# Patient Record
Sex: Female | Born: 1960
Health system: Southern US, Community
[De-identification: ages and names within clinical notes are randomized; demographics above are authoritative.]

## PROBLEM LIST (undated history)

## (undated) DIAGNOSIS — I341 Nonrheumatic mitral (valve) prolapse: Secondary | ICD-10-CM

## (undated) DIAGNOSIS — D219 Benign neoplasm of connective and other soft tissue, unspecified: Secondary | ICD-10-CM

## (undated) DIAGNOSIS — N8003 Adenomyosis of the uterus: Secondary | ICD-10-CM

## (undated) DIAGNOSIS — N6459 Other signs and symptoms in breast: Secondary | ICD-10-CM

## (undated) DIAGNOSIS — N809 Endometriosis, unspecified: Secondary | ICD-10-CM

## (undated) DIAGNOSIS — R87629 Unspecified abnormal cytological findings in specimens from vagina: Secondary | ICD-10-CM

## (undated) DIAGNOSIS — N6019 Diffuse cystic mastopathy of unspecified breast: Secondary | ICD-10-CM

## (undated) DIAGNOSIS — R87619 Unspecified abnormal cytological findings in specimens from cervix uteri: Secondary | ICD-10-CM

## (undated) DIAGNOSIS — C73 Malignant neoplasm of thyroid gland: Secondary | ICD-10-CM

## (undated) DIAGNOSIS — IMO0002 Reserved for concepts with insufficient information to code with codable children: Secondary | ICD-10-CM

## (undated) DIAGNOSIS — N800 Endometriosis of uterus: Secondary | ICD-10-CM

## (undated) HISTORY — DX: Endometriosis, unspecified: N80.9

## (undated) HISTORY — PX: CHOLECYSTECTOMY: SHX55

## (undated) HISTORY — DX: Benign neoplasm of connective and other soft tissue, unspecified: D21.9

## (undated) HISTORY — PX: BREAST BIOPSY: SHX20

## (undated) HISTORY — DX: Reserved for concepts with insufficient information to code with codable children: IMO0002

## (undated) HISTORY — DX: Endometriosis of uterus: N80.0

## (undated) HISTORY — DX: Malignant neoplasm of thyroid gland: C73

## (undated) HISTORY — DX: Adenomyosis of the uterus: N80.03

## (undated) HISTORY — PX: COLPOSCOPY: SHX161

## (undated) HISTORY — DX: Unspecified abnormal cytological findings in specimens from cervix uteri: R87.619

## (undated) HISTORY — DX: Other signs and symptoms in breast: N64.59

## (undated) HISTORY — DX: Nonrheumatic mitral (valve) prolapse: I34.1

## (undated) HISTORY — DX: Unspecified abnormal cytological findings in specimens from vagina: R87.629

## (undated) HISTORY — DX: Diffuse cystic mastopathy of unspecified breast: N60.19

---

## 2000-04-17 HISTORY — PX: TOTAL THYROIDECTOMY: SHX2547

## 2000-09-17 HISTORY — PX: OTHER SURGICAL HISTORY: SHX169

## 2009-06-17 HISTORY — PX: GALLBLADDER SURGERY: SHX652

## 2009-10-10 ENCOUNTER — Emergency Department (HOSPITAL_COMMUNITY): Admission: EM | Admit: 2009-10-10 | Discharge: 2009-10-10 | Payer: Self-pay | Admitting: Emergency Medicine

## 2010-08-25 ENCOUNTER — Ambulatory Visit: Payer: Self-pay | Admitting: Obstetrics & Gynecology

## 2010-12-04 LAB — POCT I-STAT, CHEM 8
BUN: 12 mg/dL (ref 6–23)
Calcium, Ion: 1.15 mmol/L (ref 1.12–1.32)
Chloride: 106 mEq/L (ref 96–112)
Creatinine, Ser: 0.7 mg/dL (ref 0.4–1.2)
Glucose, Bld: 92 mg/dL (ref 70–99)
Potassium: 3.6 mEq/L (ref 3.5–5.1)

## 2010-12-04 LAB — POCT CARDIAC MARKERS
CKMB, poc: <1 ng/mL — ABNORMAL LOW (ref 1.0–8.0)
Troponin i, poc: <0.05 ng/mL (ref 0.00–0.09)

## 2011-03-06 ENCOUNTER — Ambulatory Visit (INDEPENDENT_AMBULATORY_CARE_PROVIDER_SITE_OTHER): Payer: 59 | Admitting: Obstetrics & Gynecology

## 2011-03-06 DIAGNOSIS — Z01419 Encounter for gynecological examination (general) (routine) without abnormal findings: Secondary | ICD-10-CM

## 2011-03-06 DIAGNOSIS — N920 Excessive and frequent menstruation with regular cycle: Secondary | ICD-10-CM

## 2011-03-06 DIAGNOSIS — Z803 Family history of malignant neoplasm of breast: Secondary | ICD-10-CM

## 2011-03-06 DIAGNOSIS — Z113 Encounter for screening for infections with a predominantly sexual mode of transmission: Secondary | ICD-10-CM

## 2011-03-06 DIAGNOSIS — Z1272 Encounter for screening for malignant neoplasm of vagina: Secondary | ICD-10-CM

## 2011-03-07 NOTE — Assessment & Plan Note (Unsigned)
NAMEBAKER, KOGLER                ACCOUNT NO.:  [id number]  MEDICAL RECORD NO.:  [id number]          PATIENT TYPE:  LOCATION:  [facility]           FACILITY:  PHYSICIAN:  Jaynie Collins, MD          DATE OF BIRTH:  DATE OF SERVICE:  03/06/2011                                 CLINIC NOTE  REASON FOR VISIT:  Establishing gynecologic care.  The patient is a 50 year old gravida 1, para 1 with the last menstrual period of February 23, 2011, who is here to establish gynecologic care.  The patient had been previously followed at [facility] and [facility] and does report that she has a concerning family history of breast cancer (her paternal grandmother and paternal first cousins) and also a history of ovarian cancer with her paternal grandmother, the same grandmother also had colorectal cancer, and she has her father and paternal uncles with all with prostate cancer.  The patient herself has a history of thyroid cancer and underwent a total thyroidectomy in 2011.  She is currently followed by Oncology Service at [facility].  The patient has reported that she has dense breasts with fibrocystic breast disease.  She has had multiple breast biopsies at [facility] and [facility] and has had to be followed with serial breast MRIs, the last one was about a year and a half ago.  The patient is requesting [place] to help her in ordering another breast MRI to be done in [place] as she is going back there in the next couple of weeks to follow up with her oncologist and wants to continue doing her breast MRIs there.  For any gynecologic concerns, the patient does not report any abnormal bleeding, abnormal discharge, problems with intercourse, or any other gynecologic concerns.  Her last Pap smear was also about a year and a half ago.  PAST OB/GYN HISTORY:  The patient had one cesarean section at term.  She had normal menstrual period.  No abnormal bleeding.  The patient is  not on any contraceptive medicine.  She does report having abnormal Pap smear many years ago and underwent a cone biopsy.  Her last mammogram was 2 years ago, which has followed her fibrocystic disease and dense breast.  She had a colonoscopy 4 years ago and her last test for occult blood in her stool was 2 years ago.  PAST MEDICAL HISTORY:  Thyroid cancer, fibrocystic breast disease, mitral valve prolapse, and pneumonia in the past.  PAST SURGICAL HISTORY:  Numerous breast biopsies, total thyroidectomy in 2001, cesarean section in 2004, cholecystectomy in 2010 at [facility].  MEDICATIONS:  Levoxyl 165 mcg, vitamins, and Advil as needed.  ALLERGIES:  No known drug allergies.  SOCIAL HISTORY:  The patient denies any smoking, alcohol, or illicit drug use.  Lives with her family.  FAMILY HISTORY:  Remarkable for the cancer history as above, but also an extensive family history of diabetes, heart disease, and high blood pressure.  REVIEW OF SYSTEMS:  The patient does report that she has occasional vaginal bleeding and pain with intercourse.  She does have a history of adenomyosis.  PHYSICAL EXAMINATION:  VITAL SIGNS:  Blood pressure 109/70, pulse  59, weight 150 pounds, height 5 feet 11 inches. GENERAL:  No apparent distress. HEENT:  Normocephalic, atraumatic. NECK:  A well-healed thyroid scar.  No masses palpated. LUNGS:  Clear to auscultation bilaterally. HEART:  Regular rate and rhythm. ABDOMEN:  Soft, nontender, and nondistended. BREASTS:  Symmetric, small, normal in size.  The patient does have some dense breast tissue but no discrete masses, lymphadenopathy, nipple drainage, or skin changes were found. EXTREMITIES:  No cyanosis, clubbing, or edema. PELVIC:  Normal external female genitalia.  Pink, well-rugated vagina. No bleeding seen.  Pap smear was obtained.  On bimanual exam, the patient has a normal-sized uterus.  No abnormal masses.  No adnexal tenderness or  masses elicited.  ASSESSMENT AND PLAN:  The patient is a 50 year old gravida 1, para 1 here to establish gynecologic care.  Her Pap smear was done today.  We will follow up on the results.  For her history of breast disease, we will try to obtain prior authorization from her insurance company and try to help her in booking a breast MRI at [facility] and [facility] and follow up with these results.  The patient was also counseled regarding kinetic counseling.  She says she will think about this and get back to [place] if she does want to proceed with this for irregular bleeding.  She is known to have a history of thyroid cancer and does have some thyroid dysfunction.  However, we will follow up her labs after she sees her oncologist to make sure her thyroid hormones are within control and also get a pelvic ultrasound to evaluate given her history of adenomyosis.  The patient is also going to need endometrial biopsy for further evaluation if this bleeding continues to be regular. We will follow up the pelvic ultrasound results and manage accordingly.          ______________________________ Jaynie Collins, MD    UA/MEDQ  D:  03/06/2011  T:  03/07/2011  Job:  [id number]

## 2011-03-08 ENCOUNTER — Other Ambulatory Visit: Payer: Self-pay | Admitting: Obstetrics & Gynecology

## 2011-03-08 DIAGNOSIS — N938 Other specified abnormal uterine and vaginal bleeding: Secondary | ICD-10-CM

## 2011-03-09 ENCOUNTER — Ambulatory Visit (HOSPITAL_COMMUNITY)
Admission: RE | Admit: 2011-03-09 | Discharge: 2011-03-09 | Disposition: A | Discharge status: Home / Self Care | Payer: 59 | Source: Ambulatory Visit | Attending: Obstetrics & Gynecology | Admitting: Obstetrics & Gynecology

## 2011-03-09 DIAGNOSIS — N938 Other specified abnormal uterine and vaginal bleeding: Secondary | ICD-10-CM | POA: Insufficient documentation

## 2011-03-09 DIAGNOSIS — N949 Unspecified condition associated with female genital organs and menstrual cycle: Secondary | ICD-10-CM | POA: Insufficient documentation

## 2011-03-09 DIAGNOSIS — N831 Corpus luteum cyst of ovary, unspecified side: Secondary | ICD-10-CM | POA: Insufficient documentation

## 2011-03-09 DIAGNOSIS — Z8543 Personal history of malignant neoplasm of ovary: Secondary | ICD-10-CM | POA: Insufficient documentation

## 2011-10-03 ENCOUNTER — Ambulatory Visit: Payer: 59 | Admitting: Obstetrics & Gynecology

## 2012-05-08 ENCOUNTER — Ambulatory Visit (INDEPENDENT_AMBULATORY_CARE_PROVIDER_SITE_OTHER): Payer: 59 | Admitting: Obstetrics & Gynecology

## 2012-05-08 ENCOUNTER — Encounter: Payer: Self-pay | Admitting: Obstetrics & Gynecology

## 2012-05-08 VITALS — BP 119/75 | HR 60 | Ht 71.0 in | Wt 152.0 lb

## 2012-05-08 DIAGNOSIS — N6019 Diffuse cystic mastopathy of unspecified breast: Secondary | ICD-10-CM

## 2012-05-08 DIAGNOSIS — Z124 Encounter for screening for malignant neoplasm of cervix: Secondary | ICD-10-CM

## 2012-05-08 DIAGNOSIS — C73 Malignant neoplasm of thyroid gland: Secondary | ICD-10-CM

## 2012-05-08 DIAGNOSIS — Z1151 Encounter for screening for human papillomavirus (HPV): Secondary | ICD-10-CM

## 2012-05-08 DIAGNOSIS — Z01419 Encounter for gynecological examination (general) (routine) without abnormal findings: Secondary | ICD-10-CM

## 2012-05-08 DIAGNOSIS — D219 Benign neoplasm of connective and other soft tissue, unspecified: Secondary | ICD-10-CM | POA: Insufficient documentation

## 2012-05-08 DIAGNOSIS — I341 Nonrheumatic mitral (valve) prolapse: Secondary | ICD-10-CM | POA: Insufficient documentation

## 2012-05-08 NOTE — Progress Notes (Signed)
  Subjective:     Jessica Stafford is a 51 y.o. female and is here for a comprehensive GYN exam. The patient reports no problems. Wants [place] to organize for her to get breast MRI at [facility] and [facility] in [place]. She has complex issues with her breast and multiple biopsies, and only trusts the [place] service. Had a bad experience when she tried [facility] last year, does not want to try [facility].  She is followed at [facility] yearly for her thyroid cancer, and gets her breast studies when she visits there. No other problems.  History   Social History  . Marital Status: Married    Spouse Name: N/A    Number of Children: N/A  . Years of Education: N/A   Occupational History  . Not on file.   Social History Main Topics  . Smoking status: Never Smoker   . Smokeless tobacco: Not on file  . Alcohol Use: No  . Drug Use: No  . Sexually Active: Yes -- Female partner(s)    Birth Control/ Protection: None   Other Topics Concern  . Not on file   Social History Narrative  . No narrative on file   No health maintenance topics applied.  The following portions of the patient's history were reviewed and updated as appropriate: allergies, current medications, past family history, past medical history, past social history, past surgical history and problem list.  Review of Systems Pertinent items are noted in HPI.   Objective:    Blood pressure 119/75, pulse 60, height 5\' 11"  (1.803 m), weight 152 lb (68.947 kg), last menstrual period 04/23/2012. GENERAL: Well-developed, well-nourished female in no acute distress.  HEENT: Normocephalic, atraumatic. Sclerae anicteric.  NECK: Supple. No masses palpated, incision from surgery well-healed.  LUNGS: Clear to auscultation bilaterally.  HEART: Regular rate and rhythm. BREASTS: Symmetric with everted nipples. Dense bilateral breasts with possible fibrocystic changes, no discrete lesions, skin changes, nipple drainage, or  lymphadenopathy. ABDOMEN: Soft, nontender, nondistended. No organomegaly. PELVIC: Normal external female genitalia. Vagina is pink and rugated.  Normal discharge. Normal cervix contour. Pap smear obtained. Uterus is normal in size. No adnexal mass or tenderness.  EXTREMITIES: No cyanosis, clubbing, or edema, 2+ distal pulses.   Assessment:    Healthy female exam.      Plan:    Pap done, follow up results and manage accordingly. Breast study to be scheduled in [place] as per patient request Routine preventative health maintenance measures emphasized

## 2012-05-08 NOTE — Patient Instructions (Addendum)
Preventive Care for Adults, Female A healthy lifestyle and preventive care can promote health and wellness. Preventive health guidelines for women include the following key practices.  A routine yearly physical is a good way to check with your caregiver about your health and preventive screening. It is a chance to share any concerns and updates on your health, and to receive a thorough exam.   Visit your dentist for a routine exam and preventive care every 6 months. Brush your teeth twice a day and floss once a day. Good oral hygiene prevents tooth decay and gum disease.   The frequency of eye exams is based on your age, health, family medical history, use of contact lenses, and other factors. Follow your caregiver's recommendations for frequency of eye exams.   Eat a healthy diet. Foods like vegetables, fruits, whole grains, low-fat dairy products, and lean protein foods contain the nutrients you need without too many calories. Decrease your intake of foods high in solid fats, added sugars, and salt. Eat the right amount of calories for you.Get information about a proper diet from your caregiver, if necessary.   Regular physical exercise is one of the most important things you can do for your health. Most adults should get at least 150 minutes of moderate-intensity exercise (any activity that increases your heart rate and causes you to sweat) each week. In addition, most adults need muscle-strengthening exercises on 2 or more days a week.   Maintain a healthy weight. The body mass index (BMI) is a screening tool to identify possible weight problems. It provides an estimate of body fat based on height and weight. Your caregiver can help determine your BMI, and can help you achieve or maintain a healthy weight.For adults 20 years and older:   A BMI below 18.5 is considered underweight.   A BMI of 18.5 to 24.9 is normal.   A BMI of 25 to 29.9 is considered overweight.   A BMI of 30 and above is  considered obese.   Maintain normal blood lipids and cholesterol levels by exercising and minimizing your intake of saturated fat. Eat a balanced diet with plenty of fruit and vegetables. Blood tests for lipids and cholesterol should begin at age 20 and be repeated every 5 years. If your lipid or cholesterol levels are high, you are over 50, or you are at high risk for heart disease, you may need your cholesterol levels checked more frequently.Ongoing high lipid and cholesterol levels should be treated with medicines if diet and exercise are not effective.   If you smoke, find out from your caregiver how to quit. If you do not use tobacco, do not start.   If you are pregnant, do not drink alcohol. If you are breastfeeding, be very cautious about drinking alcohol. If you are not pregnant and choose to drink alcohol, do not exceed 1 drink per day. One drink is considered to be 12 ounces (355 mL) of beer, 5 ounces (148 mL) of wine, or 1.5 ounces (44 mL) of liquor.   Avoid use of street drugs. Do not share needles with anyone. Ask for help if you need support or instructions about stopping the use of drugs.   High blood pressure causes heart disease and increases the risk of stroke. Your blood pressure should be checked at least every 1 to 2 years. Ongoing high blood pressure should be treated with medicines if weight loss and exercise are not effective.   If you are 55 to 49   years old, ask your caregiver if you should take aspirin to prevent strokes.   Diabetes screening involves taking a blood sample to check your fasting blood sugar level. This should be done once every 3 years, after age 45, if you are within normal weight and without risk factors for diabetes. Testing should be considered at a younger age or be carried out more frequently if you are overweight and have at least 1 risk factor for diabetes.   Breast cancer screening is essential preventive care for women. You should practice "breast  self-awareness." This means understanding the normal appearance and feel of your breasts and may include breast self-examination. Any changes detected, no matter how small, should be reported to a caregiver. Women in their 20s and 30s should have a clinical breast exam (CBE) by a caregiver as part of a regular health exam every 1 to 3 years. After age 40, women should have a CBE every year. Starting at age 40, women should consider having a mammography (breast X-ray test) every year. Women who have a family history of breast cancer should talk to their caregiver about genetic screening. Women at a high risk of breast cancer should talk to their caregivers about having magnetic resonance imaging (MRI) and a mammography every year.   The Pap test is a screening test for cervical cancer. A Pap test can show cell changes on the cervix that might become cervical cancer if left untreated. A Pap test is a procedure in which cells are obtained and examined from the lower end of the uterus (cervix).   Women should have a Pap test starting at age 21.   Between ages 21 and 29, Pap tests should be repeated every 2 years.   Beginning at age 30, you should have a Pap test every 3 years as long as the past 3 Pap tests have been normal.   Some women have medical problems that increase the chance of getting cervical cancer. Talk to your caregiver about these problems. It is especially important to talk to your caregiver if a new problem develops soon after your last Pap test. In these cases, your caregiver may recommend more frequent screening and Pap tests.   The above recommendations are the same for women who have or have not gotten the vaccine for human papillomavirus (HPV).   If you had a hysterectomy for a problem that was not cancer or a condition that could lead to cancer, then you no longer need Pap tests. Even if you no longer need a Pap test, a regular exam is a good idea to make sure no other problems are  starting.   If you are between ages 65 and 70, and you have had normal Pap tests going back 10 years, you no longer need Pap tests. Even if you no longer need a Pap test, a regular exam is a good idea to make sure no other problems are starting.   If you have had past treatment for cervical cancer or a condition that could lead to cancer, you need Pap tests and screening for cancer for at least 20 years after your treatment.   If Pap tests have been discontinued, risk factors (such as a new sexual partner) need to be reassessed to determine if screening should be resumed.   The HPV test is an additional test that may be used for cervical cancer screening. The HPV test looks for the virus that can cause the cell changes on the cervix.   The cells collected during the Pap test can be tested for HPV. The HPV test could be used to screen women aged 30 years and older, and should be used in women of any age who have unclear Pap test results. After the age of 30, women should have HPV testing at the same frequency as a Pap test.   Colorectal cancer can be detected and often prevented. Most routine colorectal cancer screening begins at the age of 50 and continues through age 75. However, your caregiver may recommend screening at an earlier age if you have risk factors for colon cancer. On a yearly basis, your caregiver may provide home test kits to check for hidden blood in the stool. Use of a small camera at the end of a tube, to directly examine the colon (sigmoidoscopy or colonoscopy), can detect the earliest forms of colorectal cancer. Talk to your caregiver about this at age 50, when routine screening begins. Direct examination of the colon should be repeated every 5 to 10 years through age 75, unless early forms of pre-cancerous polyps or small growths are found.   Hepatitis C blood testing is recommended for all people born from 1945 through 1965 and any individual with known risks for hepatitis C.    Practice safe sex. Use condoms and avoid high-risk sexual practices to reduce the spread of sexually transmitted infections (STIs). STIs include gonorrhea, chlamydia, syphilis, trichomonas, herpes, HPV, and human immunodeficiency virus (HIV). Herpes, HIV, and HPV are viral illnesses that have no cure. They can result in disability, cancer, and death. Sexually active women aged 25 and younger should be checked for chlamydia. Older women with new or multiple partners should also be tested for chlamydia. Testing for other STIs is recommended if you are sexually active and at increased risk.   Osteoporosis is a disease in which the bones lose minerals and strength with aging. This can result in serious bone fractures. The risk of osteoporosis can be identified using a bone density scan. Women ages 65 and over and women at risk for fractures or osteoporosis should discuss screening with their caregivers. Ask your caregiver whether you should take a calcium supplement or vitamin D to reduce the rate of osteoporosis.   Menopause can be associated with physical symptoms and risks. Hormone replacement therapy is available to decrease symptoms and risks. You should talk to your caregiver about whether hormone replacement therapy is right for you.   Use sunscreen with sun protection factor (SPF) of 30 or more. Apply sunscreen liberally and repeatedly throughout the day. You should seek shade when your shadow is shorter than you. Protect yourself by wearing long sleeves, pants, a wide-brimmed hat, and sunglasses year round, whenever you are outdoors.   Once a month, do a whole body skin exam, using a mirror to look at the skin on your back. Notify your caregiver of new moles, moles that have irregular borders, moles that are larger than a pencil eraser, or moles that have changed in shape or color.   Stay current with required immunizations.   Influenza. You need a dose every fall (or winter). The composition of  the flu vaccine changes each year, so being vaccinated once is not enough.   Pneumococcal polysaccharide. You need 1 to 2 doses if you smoke cigarettes or if you have certain chronic medical conditions. You need 1 dose at age 65 (or older) if you have never been vaccinated.   Tetanus, diphtheria, pertussis (Tdap, Td). Get 1 dose of   Tdap vaccine if you are younger than age 65, are over 65 and have contact with an infant, are a healthcare worker, are pregnant, or simply want to be protected from whooping cough. After that, you need a Td booster dose every 10 years. Consult your caregiver if you have not had at least 3 tetanus and diphtheria-containing shots sometime in your life or have a deep or dirty wound.   HPV. You need this vaccine if you are a woman age 26 or younger. The vaccine is given in 3 doses over 6 months.   Measles, mumps, rubella (MMR). You need at least 1 dose of MMR if you were born in 1957 or later. You may also need a second dose.   Meningococcal. If you are age 19 to 21 and a first-year college student living in a residence hall, or have one of several medical conditions, you need to get vaccinated against meningococcal disease. You may also need additional booster doses.   Zoster (shingles). If you are age 60 or older, you should get this vaccine.   Varicella (chickenpox). If you have never had chickenpox or you were vaccinated but received only 1 dose, talk to your caregiver to find out if you need this vaccine.   Hepatitis A. You need this vaccine if you have a specific risk factor for hepatitis A virus infection or you simply wish to be protected from this disease. The vaccine is usually given as 2 doses, 6 to 18 months apart.   Hepatitis B. You need this vaccine if you have a specific risk factor for hepatitis B virus infection or you simply wish to be protected from this disease. The vaccine is given in 3 doses, usually over 6 months.  Preventive Services /  Frequency Ages 19 to 39  Blood pressure check.** / Every 1 to 2 years.   Lipid and cholesterol check.** / Every 5 years beginning at age 20.   Clinical breast exam.** / Every 3 years for women in their 20s and 30s.   Pap test.** / Every 2 years from ages 21 through 29. Every 3 years starting at age 30 through age 65 or 70 with a history of 3 consecutive normal Pap tests.   HPV screening.** / Every 3 years from ages 30 through ages 65 to 70 with a history of 3 consecutive normal Pap tests.   Hepatitis C blood test.** / For any individual with known risks for hepatitis C.   Skin self-exam. / Monthly.   Influenza immunization.** / Every year.   Pneumococcal polysaccharide immunization.** / 1 to 2 doses if you smoke cigarettes or if you have certain chronic medical conditions.   Tetanus, diphtheria, pertussis (Tdap, Td) immunization. / A one-time dose of Tdap vaccine. After that, you need a Td booster dose every 10 years.   HPV immunization. / 3 doses over 6 months, if you are 26 and younger.   Measles, mumps, rubella (MMR) immunization. / You need at least 1 dose of MMR if you were born in 1957 or later. You may also need a second dose.   Meningococcal immunization. / 1 dose if you are age 19 to 21 and a first-year college student living in a residence hall, or have one of several medical conditions, you need to get vaccinated against meningococcal disease. You may also need additional booster doses.   Varicella immunization.** / Consult your caregiver.   Hepatitis A immunization.** / Consult your caregiver. 2 doses, 6 to 18 months   apart.   Hepatitis B immunization.** / Consult your caregiver. 3 doses usually over 6 months.  Ages 40 to 64  Blood pressure check.** / Every 1 to 2 years.   Lipid and cholesterol check.** / Every 5 years beginning at age 20.   Clinical breast exam.** / Every year after age 40.   Mammogram.** / Every year beginning at age 40 and continuing for as  long as you are in good health. Consult with your caregiver.   Pap test.** / Every 3 years starting at age 30 through age 65 or 70 with a history of 3 consecutive normal Pap tests.   HPV screening.** / Every 3 years from ages 30 through ages 65 to 70 with a history of 3 consecutive normal Pap tests.   Fecal occult blood test (FOBT) of stool. / Every year beginning at age 50 and continuing until age 75. You may not need to do this test if you get a colonoscopy every 10 years.   Flexible sigmoidoscopy or colonoscopy.** / Every 5 years for a flexible sigmoidoscopy or every 10 years for a colonoscopy beginning at age 50 and continuing until age 75.   Hepatitis C blood test.** / For all people born from 1945 through 1965 and any individual with known risks for hepatitis C.   Skin self-exam. / Monthly.   Influenza immunization.** / Every year.   Pneumococcal polysaccharide immunization.** / 1 to 2 doses if you smoke cigarettes or if you have certain chronic medical conditions.   Tetanus, diphtheria, pertussis (Tdap, Td) immunization.** / A one-time dose of Tdap vaccine. After that, you need a Td booster dose every 10 years.   Measles, mumps, rubella (MMR) immunization. / You need at least 1 dose of MMR if you were born in 1957 or later. You may also need a second dose.   Varicella immunization.** / Consult your caregiver.   Meningococcal immunization.** / Consult your caregiver.   Hepatitis A immunization.** / Consult your caregiver. 2 doses, 6 to 18 months apart.   Hepatitis B immunization.** / Consult your caregiver. 3 doses, usually over 6 months.  Ages 65 and over  Blood pressure check.** / Every 1 to 2 years.   Lipid and cholesterol check.** / Every 5 years beginning at age 20.   Clinical breast exam.** / Every year after age 40.   Mammogram.** / Every year beginning at age 40 and continuing for as long as you are in good health. Consult with your caregiver.   Pap test.** /  Every 3 years starting at age 30 through age 65 or 70 with a 3 consecutive normal Pap tests. Testing can be stopped between 65 and 70 with 3 consecutive normal Pap tests and no abnormal Pap or HPV tests in the past 10 years.   HPV screening.** / Every 3 years from ages 30 through ages 65 or 70 with a history of 3 consecutive normal Pap tests. Testing can be stopped between 65 and 70 with 3 consecutive normal Pap tests and no abnormal Pap or HPV tests in the past 10 years.   Fecal occult blood test (FOBT) of stool. / Every year beginning at age 50 and continuing until age 75. You may not need to do this test if you get a colonoscopy every 10 years.   Flexible sigmoidoscopy or colonoscopy.** / Every 5 years for a flexible sigmoidoscopy or every 10 years for a colonoscopy beginning at age 50 and continuing until age 75.   Hepatitis   C blood test.** / For all people born from 1945 through 1965 and any individual with known risks for hepatitis C.   Osteoporosis screening.** / A one-time screening for women ages 65 and over and women at risk for fractures or osteoporosis.   Skin self-exam. / Monthly.   Influenza immunization.** / Every year.   Pneumococcal polysaccharide immunization.** / 1 dose at age 65 (or older) if you have never been vaccinated.   Tetanus, diphtheria, pertussis (Tdap, Td) immunization. / A one-time dose of Tdap vaccine if you are over 65 and have contact with an infant, are a healthcare worker, or simply want to be protected from whooping cough. After that, you need a Td booster dose every 10 years.   Varicella immunization.** / Consult your caregiver.   Meningococcal immunization.** / Consult your caregiver.   Hepatitis A immunization.** / Consult your caregiver. 2 doses, 6 to 18 months apart.   Hepatitis B immunization.** / Check with your caregiver. 3 doses, usually over 6 months.  ** Family history and personal history of risk and conditions may change your caregiver's  recommendations. Document Released: 10/30/2001 Document Revised: 08/23/2011 Document Reviewed: 01/29/2011 [employer] Patient Information 2012 [employer]. 

## 2012-06-18 ENCOUNTER — Telehealth: Payer: Self-pay

## 2012-06-18 NOTE — Telephone Encounter (Signed)
After a while trying to set up this patient for a Breast MRI I was finally able to do it. I didn't have cpt codes or a referral in the system so this complicated me being able to schedule it. I called our radiology to ask for cpt code and was able to schedule it. Patient requested [facility] in [place]. She preffers to go there every year. I called the Radiology at [phone] gave them her MR# [id number] and Darl Pikes helped me schedule her for 07/01/12 at 1:20pm. They requested a order be faxed to them at [phone]. I called her Insurance carrier spt to another Darl Pikes and she did not need a authorization. Patient was informed of the appointment time and date.

## 2012-07-04 ENCOUNTER — Telehealth: Payer: Self-pay

## 2012-09-24 ENCOUNTER — Encounter: Payer: Self-pay | Admitting: Obstetrics & Gynecology

## 2013-03-13 ENCOUNTER — Telehealth: Payer: Self-pay | Admitting: Obstetrics & Gynecology

## 2013-03-13 DIAGNOSIS — Z809 Family history of malignant neoplasm, unspecified: Secondary | ICD-10-CM

## 2013-03-13 DIAGNOSIS — Z8041 Family history of malignant neoplasm of ovary: Secondary | ICD-10-CM

## 2013-03-13 NOTE — Telephone Encounter (Signed)
Patient called stating it is time for her yearly ultrasound due to family history of ovarian cancer.  Patient needs to have referral made if test is needed.

## 2013-03-14 DIAGNOSIS — Z809 Family history of malignant neoplasm, unspecified: Secondary | ICD-10-CM | POA: Insufficient documentation

## 2013-03-14 NOTE — Addendum Note (Signed)
Addended by: Jaynie Collins A on: 03/14/2013 04:28 PM   Modules accepted: Orders

## 2013-03-14 NOTE — Telephone Encounter (Signed)
Pelvic ultrasound ordered.  Please schedule and notify patient.  She is due for annual GYN exam after 05/08/2013.

## 2013-04-07 ENCOUNTER — Ambulatory Visit (HOSPITAL_COMMUNITY): Payer: Self-pay

## 2013-04-15 ENCOUNTER — Ambulatory Visit: Payer: 59 | Admitting: Family Medicine

## 2013-04-30 ENCOUNTER — Ambulatory Visit (HOSPITAL_COMMUNITY)
Admission: RE | Admit: 2013-04-30 | Discharge: 2013-04-30 | Disposition: A | Discharge status: Home / Self Care | Payer: BC Managed Care – PPO | Source: Ambulatory Visit | Attending: Obstetrics & Gynecology | Admitting: Obstetrics & Gynecology

## 2013-04-30 DIAGNOSIS — Z809 Family history of malignant neoplasm, unspecified: Secondary | ICD-10-CM

## 2013-04-30 DIAGNOSIS — Z8041 Family history of malignant neoplasm of ovary: Secondary | ICD-10-CM | POA: Insufficient documentation

## 2013-04-30 DIAGNOSIS — D259 Leiomyoma of uterus, unspecified: Secondary | ICD-10-CM | POA: Insufficient documentation

## 2013-05-04 ENCOUNTER — Telehealth: Payer: Self-pay | Admitting: *Deleted

## 2013-05-04 NOTE — Telephone Encounter (Signed)
Message copied by Grayland Ormond on Mon May 04, 2013  8:22 AM ------      Message from: Jaynie Collins A      Created: Thu Apr 30, 2013  1:42 PM       Normal ovaries, just shows fibroids.  Please call to inform patient of results.       ------

## 2013-05-04 NOTE — Telephone Encounter (Signed)
Pt aware of results 

## 2013-07-15 NOTE — Telephone Encounter (Signed)
Called patient about appointments and ultrasound. Left her a message.

## 2013-10-29 ENCOUNTER — Ambulatory Visit (INDEPENDENT_AMBULATORY_CARE_PROVIDER_SITE_OTHER): Payer: BC Managed Care – PPO | Admitting: Obstetrics & Gynecology

## 2013-10-29 ENCOUNTER — Encounter: Payer: Self-pay | Admitting: Obstetrics & Gynecology

## 2013-10-29 VITALS — BP 118/77 | HR 74 | Ht 71.0 in | Wt 154.0 lb

## 2013-10-29 DIAGNOSIS — Z0000 Encounter for general adult medical examination without abnormal findings: Secondary | ICD-10-CM

## 2013-10-29 DIAGNOSIS — Z124 Encounter for screening for malignant neoplasm of cervix: Secondary | ICD-10-CM

## 2013-10-29 DIAGNOSIS — Z1151 Encounter for screening for human papillomavirus (HPV): Secondary | ICD-10-CM

## 2013-10-29 DIAGNOSIS — Z01419 Encounter for gynecological examination (general) (routine) without abnormal findings: Secondary | ICD-10-CM

## 2013-10-29 NOTE — Progress Notes (Signed)
Subjective:    Jessica Stafford is a 53 y.o. female who presents for an annual exam. The patient has no complaints today.She thinks that she may have BV, odor. Her period also has gone longer than usual.  The patient is sexually active. GYN screening history: last pap: was normal. The patient wears seatbelts: yes. The patient participates in regular exercise: yes. Has the patient ever been transfused or tattooed?: no. The patient reports that there is not domestic violence in her life.   Menstrual History: OB History   Grav Para Term Preterm Abortions TAB SAB Ect Mult Living   1 1 1       1       Menarche age: 63    Patient's last menstrual period was 10/19/2013.    The following portions of the patient's history were reviewed and updated as appropriate: allergies, current medications, past family history, past medical history, past social history, past surgical history and problem list.  Review of Systems A comprehensive review of systems was negative. Married for 17 years. Homemaker. Flu vaccine given 10/14.   Objective:    BP 118/77  Pulse 74  Ht 5\' 11"  (1.803 m)  Wt 154 lb (69.854 kg)  BMI 21.49 kg/m2  LMP 10/19/2013  General Appearance:    Alert, cooperative, no distress, appears stated age  Head:    Normocephalic, without obvious abnormality, atraumatic  Eyes:    PERRL, conjunctiva/corneas clear, EOM's intact, fundi    benign, both eyes  Ears:    Normal TM's and external ear canals, both ears  Nose:   Nares normal, septum midline, mucosa normal, no drainage    or sinus tenderness  Throat:   Lips, mucosa, and tongue normal; teeth and gums normal  Neck:   Supple, symmetrical, trachea midline, no adenopathy;    thyroid:  no enlargement/tenderness/nodules; no carotid   bruit or JVD  Back:     Symmetric, no curvature, ROM normal, no CVA tenderness  Lungs:     Clear to auscultation bilaterally, respirations unlabored  Chest Wall:    No tenderness or deformity   Heart:     Regular rate and rhythm, S1 and S2 normal, no murmur, rub   or gallop  Breast Exam:    No tenderness, masses, or nipple abnormality  Abdomen:     Soft, non-tender, bowel sounds active all four quadrants,    no masses, no organomegaly  Genitalia:    Normal female without lesion, discharge or tenderness     Extremities:   Extremities normal, atraumatic, no cyanosis or edema  Pulses:   2+ and symmetric all extremities  Skin:   Skin color, texture, turgor normal, no rashes or lesions  Lymph nodes:   Cervical, supraclavicular, and axillary nodes normal  Neurologic:   CNII-XII intact, normal strength, sensation and reflexes    throughout  .    Assessment:    Healthy female exam.    Plan:     Breast self exam technique reviewed and patient encouraged to perform self-exam monthly.  Annual MRI in [place], due in the near future Pap with cotesting

## 2013-11-04 ENCOUNTER — Telehealth: Payer: Self-pay | Admitting: *Deleted

## 2013-11-04 NOTE — Telephone Encounter (Signed)
Message copied by Erik Obey on Wed Nov 04, 2013  3:51 PM ------      Message from: Clovia Cuff C      Created: Wed Nov 04, 2013  2:03 PM       She will need a colpo.      Thanks ------

## 2013-11-04 NOTE — Telephone Encounter (Signed)
Patient has been notified of results and she will call us back in the morning once she has her schedule in front of her to set up an appointment in [place] with Dr. Hulan Fray.

## 2014-07-19 ENCOUNTER — Encounter: Payer: Self-pay | Admitting: Obstetrics & Gynecology

## 2015-07-26 ENCOUNTER — Ambulatory Visit (INDEPENDENT_AMBULATORY_CARE_PROVIDER_SITE_OTHER): Payer: BLUE CROSS/BLUE SHIELD | Admitting: Obstetrics & Gynecology

## 2015-07-26 VITALS — BP 122/83 | HR 61 | Resp 18 | Ht 71.0 in | Wt 144.0 lb

## 2015-07-26 DIAGNOSIS — Z30430 Encounter for insertion of intrauterine contraceptive device: Secondary | ICD-10-CM | POA: Diagnosis not present

## 2015-07-26 DIAGNOSIS — N921 Excessive and frequent menstruation with irregular cycle: Secondary | ICD-10-CM | POA: Diagnosis not present

## 2015-07-26 DIAGNOSIS — Z01812 Encounter for preprocedural laboratory examination: Secondary | ICD-10-CM

## 2015-07-26 LAB — CBC
HCT: 31 % — ABNORMAL LOW (ref 36.0–46.0)
Hemoglobin: 10.6 g/dL — ABNORMAL LOW (ref 12.0–15.0)
MCH: 31.6 pg (ref 26.0–34.0)
MCHC: 34.2 g/dL (ref 30.0–36.0)
MCV: 92.5 fL (ref 78.0–100.0)
MPV: 10.1 fL (ref 8.6–12.4)
PLATELETS: 291 10*3/uL (ref 150–400)
RBC: 3.35 MIL/uL — ABNORMAL LOW (ref 3.87–5.11)
RDW: 13.6 % (ref 11.5–15.5)
WBC: 5.4 10*3/uL (ref 4.0–10.5)

## 2015-07-26 LAB — POCT URINE PREGNANCY: PREG TEST UR: NEGATIVE

## 2015-07-26 MED ORDER — MEGESTROL ACETATE 40 MG PO TABS: 40.0000 mg | ORAL_TABLET | Freq: Two times a day (BID) | ORAL | Status: AC

## 2015-07-26 MED ORDER — IBUPROFEN 200 MG PO TABS
800.0000 mg | ORAL_TABLET | Freq: Once | ORAL | Status: AC
Start: 2015-07-26 — End: 2015-07-26
  Administered 2015-07-26: 800 mg via ORAL

## 2015-07-26 NOTE — Progress Notes (Signed)
Pt states her cycles have become irregular, started her cycle 2 weeks ago and has been having heavy vaginal bleeding since.

## 2015-07-26 NOTE — Progress Notes (Signed)
   Subjective:    Patient ID: Jessica Stafford, female    DOB: [birth year], 54 y.o.   MRN: [id number]  HPI 54 yo MW P1 (25 yo son) here to discuss her heavy bleeding for the last 2 weeks. Prior to this episode of bleeding, she had gone 4 months without bleeding.    Review of Systems She had a colpo in [place] [facility] in Spring 2015. Pap also in [facility] this summer "normal". Her husband has had prostate cancer and she is not exposed to sperm. He is considered sterile. She had a gyn u/s this past summer in [place]. She also had a EMBX 2015 in [place], also negative. She declines flu vaccine    Objective:   Physical Exam [id number] Breathing, conversing, and ambulating normally  UPT negative, consent signed, Time out procedure done. Cervix prepped with betadine and grasped with a single tooth tenaculum. Mirena was easily placed and the strings were cut to 3-4 cm. Uterus sounded to 9 cm. She tolerated the procedure well.         Assessment & Plan:  DUB/perimenopausal- offered cyclic progestin, continuous progestin, OCPs (but she has sore boobs), endometrial ablation, Mirena  Megace 40 mg BID for 10 days RTC prn

## 2015-07-26 NOTE — Addendum Note (Signed)
Addended by: Ricka Burdock on: 07/26/2015 09:36 AM   Modules accepted: Orders

## 2015-07-27 ENCOUNTER — Encounter: Payer: Self-pay | Admitting: *Deleted

## 2015-08-04 ENCOUNTER — Telehealth: Payer: Self-pay | Admitting: *Deleted

## 2015-08-04 DIAGNOSIS — D259 Leiomyoma of uterus, unspecified: Secondary | ICD-10-CM

## 2015-08-04 NOTE — Telephone Encounter (Signed)
-----   Message from Francia Greaves sent at 08/04/2015  8:51 AM EST ----- Regarding: Patient Contact: [phone] Janny called the office this morning, to schedule an appt for AUB on 11/21, she is requesting to have an ultrasound done prior to this visit and would like to discuss results with you at this visit. Would you like to put in an order for this or would you rather her come to the visit first?

## 2015-08-04 NOTE — Telephone Encounter (Signed)
Spoke to pt, has been seen in office for AUB, was given rx for Megace 40mg  and IUD inserted on 07-26-15.  Has a hx of uterine fibroids as well.  States that her bleeding is continuing and is increasing.  Informed pt that her symptoms could be related to the IUD as it is trying to thin the inner lining of her uterus and can happen the first few months after insertion.  Pt requesting [place] due to her concerns and would like to make sure the fibroids are not getting larger.  Dr Elly Modena was in office at the time of the patients call and reviewed pt chart.  Stated we could order follow-up US.  Pt also scheduled appt for 11-21 with Dr Hulan Fray.  Informed pt that we could just call her with the [place] report and notify her of any recommendations bc her bleeding at this time was more than likely coming from the IUD just recently being inserted.  Pt was adamant about keeping her appointment and coming in to follow-up with Dr Hulan Fray on 08-08-15.

## 2015-08-08 ENCOUNTER — Ambulatory Visit: Payer: BLUE CROSS/BLUE SHIELD | Admitting: Obstetrics & Gynecology

## 2015-08-10 ENCOUNTER — Ambulatory Visit (HOSPITAL_COMMUNITY): Payer: BLUE CROSS/BLUE SHIELD

## 2015-08-15 ENCOUNTER — Ambulatory Visit (HOSPITAL_COMMUNITY)
Admission: RE | Admit: 2015-08-15 | Discharge: 2015-08-15 | Disposition: A | Discharge status: Home / Self Care | Payer: BLUE CROSS/BLUE SHIELD | Source: Ambulatory Visit | Attending: Obstetrics and Gynecology | Admitting: Obstetrics and Gynecology

## 2015-08-15 DIAGNOSIS — Z30431 Encounter for routine checking of intrauterine contraceptive device: Secondary | ICD-10-CM | POA: Insufficient documentation

## 2015-08-15 DIAGNOSIS — N939 Abnormal uterine and vaginal bleeding, unspecified: Secondary | ICD-10-CM | POA: Insufficient documentation

## 2015-08-15 DIAGNOSIS — D259 Leiomyoma of uterus, unspecified: Secondary | ICD-10-CM | POA: Insufficient documentation

## 2015-08-15 DIAGNOSIS — N800 Endometriosis of uterus: Secondary | ICD-10-CM | POA: Diagnosis not present

## 2015-08-17 ENCOUNTER — Encounter: Payer: Self-pay | Admitting: Obstetrics & Gynecology

## 2015-08-17 ENCOUNTER — Ambulatory Visit (INDEPENDENT_AMBULATORY_CARE_PROVIDER_SITE_OTHER): Payer: BLUE CROSS/BLUE SHIELD | Admitting: Obstetrics & Gynecology

## 2015-08-17 VITALS — BP 136/67 | HR 63 | Temp 98.4°F | Ht 71.0 in | Wt 149.0 lb

## 2015-08-17 DIAGNOSIS — D259 Leiomyoma of uterus, unspecified: Secondary | ICD-10-CM | POA: Diagnosis not present

## 2015-08-17 MED ORDER — MEDROXYPROGESTERONE ACETATE 10 MG PO TABS: 10.0000 mg | ORAL_TABLET | Freq: Every day | ORAL | Status: AC

## 2015-08-17 NOTE — Progress Notes (Signed)
   Subjective:    Patient ID: Jessica Stafford, female    DOB: 1961/07/26, 54 y.o.   MRN: [id number]  HPI  54 yo [facility] patient here today for a IUD check. She had it placed 07/26/15 for some perimenopausal bleeding. An u/s was ordered and showed stable uterus and normal adnexa and appropriate placement of Mirena. She is having some irregular bleeding with the Mirena but knows that this is expected. She started taking megace 40 mg daily at the same time her IUD was placed. She gives a h/o this same irregular bleeding in about 1996.  Review of Systems     Objective:   Physical Exam [id number] Breathing, conversing, and ambulating normally Abd- benign       Assessment & Plan:  DUB- very very tired of DUB In the meantime, until Mirena starts to give her amenorrhea, change to provera 10 mg daily.

## 2015-12-28 DIAGNOSIS — I872 Venous insufficiency (chronic) (peripheral): Secondary | ICD-10-CM | POA: Diagnosis not present

## 2015-12-28 DIAGNOSIS — I83813 Varicose veins of bilateral lower extremities with pain: Secondary | ICD-10-CM | POA: Diagnosis not present

## 2016-01-31 DIAGNOSIS — C73 Malignant neoplasm of thyroid gland: Secondary | ICD-10-CM | POA: Diagnosis not present

## 2016-02-01 DIAGNOSIS — N939 Abnormal uterine and vaginal bleeding, unspecified: Secondary | ICD-10-CM | POA: Diagnosis not present

## 2016-02-01 DIAGNOSIS — Z9089 Acquired absence of other organs: Secondary | ICD-10-CM | POA: Diagnosis not present

## 2016-02-01 DIAGNOSIS — D259 Leiomyoma of uterus, unspecified: Secondary | ICD-10-CM | POA: Diagnosis not present

## 2016-02-01 DIAGNOSIS — C73 Malignant neoplasm of thyroid gland: Secondary | ICD-10-CM | POA: Diagnosis not present

## 2016-02-01 DIAGNOSIS — Z3202 Encounter for pregnancy test, result negative: Secondary | ICD-10-CM | POA: Diagnosis not present

## 2016-02-01 DIAGNOSIS — Z85850 Personal history of malignant neoplasm of thyroid: Secondary | ICD-10-CM | POA: Diagnosis not present

## 2016-02-02 DIAGNOSIS — Z872 Personal history of diseases of the skin and subcutaneous tissue: Secondary | ICD-10-CM | POA: Diagnosis not present

## 2016-02-02 DIAGNOSIS — Z808 Family history of malignant neoplasm of other organs or systems: Secondary | ICD-10-CM | POA: Diagnosis not present

## 2016-02-02 DIAGNOSIS — L814 Other melanin hyperpigmentation: Secondary | ICD-10-CM | POA: Diagnosis not present

## 2016-02-02 DIAGNOSIS — Z86018 Personal history of other benign neoplasm: Secondary | ICD-10-CM | POA: Diagnosis not present

## 2016-02-17 ENCOUNTER — Other Ambulatory Visit (HOSPITAL_COMMUNITY): Payer: Self-pay | Admitting: Internal Medicine

## 2016-02-17 DIAGNOSIS — C73 Malignant neoplasm of thyroid gland: Secondary | ICD-10-CM

## 2016-03-05 ENCOUNTER — Ambulatory Visit (HOSPITAL_COMMUNITY): Payer: BLUE CROSS/BLUE SHIELD

## 2016-03-06 ENCOUNTER — Ambulatory Visit (HOSPITAL_COMMUNITY): Payer: BLUE CROSS/BLUE SHIELD

## 2016-03-07 ENCOUNTER — Inpatient Hospital Stay (HOSPITAL_COMMUNITY)
Admission: RE | Admit: 2016-03-07 | Discharge: 2016-03-07 | Disposition: A | Discharge status: Home / Self Care | Payer: BLUE CROSS/BLUE SHIELD | Source: Ambulatory Visit | Attending: Internal Medicine | Admitting: Internal Medicine

## 2016-03-09 ENCOUNTER — Encounter (HOSPITAL_COMMUNITY): Payer: BLUE CROSS/BLUE SHIELD

## 2016-04-04 DIAGNOSIS — Z09 Encounter for follow-up examination after completed treatment for conditions other than malignant neoplasm: Secondary | ICD-10-CM | POA: Diagnosis not present

## 2016-04-04 DIAGNOSIS — I83811 Varicose veins of right lower extremities with pain: Secondary | ICD-10-CM | POA: Diagnosis not present

## 2016-05-03 DIAGNOSIS — L578 Other skin changes due to chronic exposure to nonionizing radiation: Secondary | ICD-10-CM | POA: Diagnosis not present

## 2016-05-03 DIAGNOSIS — D229 Melanocytic nevi, unspecified: Secondary | ICD-10-CM | POA: Diagnosis not present

## 2016-05-03 DIAGNOSIS — D485 Neoplasm of uncertain behavior of skin: Secondary | ICD-10-CM | POA: Diagnosis not present

## 2016-05-03 DIAGNOSIS — L570 Actinic keratosis: Secondary | ICD-10-CM | POA: Diagnosis not present

## 2016-05-03 DIAGNOSIS — L820 Inflamed seborrheic keratosis: Secondary | ICD-10-CM | POA: Diagnosis not present

## 2016-05-15 ENCOUNTER — Encounter: Payer: Self-pay | Admitting: Obstetrics & Gynecology

## 2016-05-15 ENCOUNTER — Ambulatory Visit (INDEPENDENT_AMBULATORY_CARE_PROVIDER_SITE_OTHER): Payer: BLUE CROSS/BLUE SHIELD | Admitting: Obstetrics & Gynecology

## 2016-05-15 VITALS — BP 131/78 | HR 69 | Ht 71.0 in | Wt 145.0 lb

## 2016-05-15 DIAGNOSIS — N939 Abnormal uterine and vaginal bleeding, unspecified: Secondary | ICD-10-CM

## 2016-05-15 DIAGNOSIS — Z30432 Encounter for removal of intrauterine contraceptive device: Secondary | ICD-10-CM

## 2016-05-16 DIAGNOSIS — N939 Abnormal uterine and vaginal bleeding, unspecified: Secondary | ICD-10-CM | POA: Insufficient documentation

## 2016-05-16 NOTE — Progress Notes (Signed)
    GYNECOLOGY CLINIC PROCEDURE NOTE  Jessica Stafford is a 55 y.o. G1P1001 here for Mirena IUD removal. Mirena was placed on 07/26/2015 to help with AUB.  She does not want this anymore, now desires surgical management. on No GYN concerns.  Last pap smear was on 10/29/13 and showed ASCUS, +HRHPV with negative colposcopy.  Normal follow up pap in 2016 at [facility] was negative.  IUD Removal  Patient identified, informed consent performed, consent signed.  Patient was in the dorsal lithotomy position, normal external genitalia was noted.  A speculum was placed in the patient's vagina, normal discharge was noted, no lesions. The cervix was visualized, no lesions, no abnormal discharge.  The strings of the IUD were grasped and pulled using ring forceps. The IUD was removed in its entirety. Patient tolerated the procedure well.    Discussed surgical management options for abnormal uterine bleeding including endometrial ablation (Novasure/Hydrothermal Ablation) or hysterectomy as definitive surgical management.  Discussed risks and benefits of each method. All questions were answered.   Patient desires endometrial ablation. She declines any interim hormonal treatment, bleeding precautions reviewed.  She was told that she will be contacted by our surgical scheduler regarding the time and date of her surgery; routine preoperative instructions of having nothing to eat or drink after midnight on the day prior to surgery and also coming to the hospital 1.5 hours prior to her time of surgery were also emphasized.  She was told she may be called for a preoperative appointment about a week prior to surgery and will be given further preoperative instructions at that visit. Printed patient education handouts about the procedure were given to the patient to review at home.  Total counseling time: 25 minutes   Verita Schneiders, MD, [place] Attending [place], [facility] for  [employer], [place]

## 2016-05-16 NOTE — Patient Instructions (Signed)
Endometrial Ablation °Endometrial ablation removes the lining of the uterus (endometrium). It is usually a same-day, outpatient treatment. Ablation helps avoid major surgery, such as surgery to remove the cervix and uterus (hysterectomy). After endometrial ablation, you will have little or no menstrual bleeding and may not be able to have children. However, if you are premenopausal, you will need to use a reliable method of birth control following the procedure because of the small chance that pregnancy can occur. °There are different reasons to have this procedure. These reasons include: °· Heavy periods. °· Bleeding that is causing anemia. °· Irregular bleeding. °· Bleeding fibroids on the lining inside the uterus if they are smaller than 3 centimeters. °This procedure may not be possible for you if:  °· You want to have children in the future.   °· You have severe cramps with your menstrual period.   °· You have precancerous or cancerous cells in your uterus.   °· You were recently pregnant.   °· You have gone through menopause.   °· You have had major surgery on your uterus, resulting in thinning of the uterine wall. Surgeries may include: °¨ The removal of one or more uterine fibroids (myomectomy). °¨ A cesarean section with a classic (vertical) incision on your uterus. Ask your health care provider what type of cesarean you had. Sometimes the scar on your skin is different than the scar on your uterus. °Even if you have had surgery on your uterus, certain types of ablation may still be safe for you. Talk with your health care provider. °LET YOUR HEALTH CARE PROVIDER KNOW ABOUT: °· Any allergies you have. °· All medicines you are taking, including vitamins, herbs, eye drops, creams, and over-the-counter medicines. °· Previous problems you or members of your family have had with the use of anesthetics. °· Any blood disorders you have. °· Previous surgeries you have had. °· Medical conditions you have. °RISKS AND  COMPLICATIONS  °Generally, this is a safe procedure. However, as with any procedure, complications can occur. Possible complications include: °· Perforation of the uterus. °· Bleeding. °· Infection of the uterus, bladder, or vagina. °· Injury to surrounding organs. °· An air bubble to the lung (air embolus). °· Pregnancy following the procedure. °· Failure of the procedure to help the problem, requiring hysterectomy. °· Decreased ability to diagnose cancer in the lining of the uterus. °BEFORE THE PROCEDURE °· The lining of the uterus must be tested to make sure there is no pre-cancerous or cancer cells present. °· An ultrasound may be performed to look at the size of the uterus and to check for abnormalities. °· Medicines may be given to thin the lining of the uterus. °PROCEDURE  °During the procedure, your health care provider will use a tool called a resectoscope to help see inside your uterus. There are different ways to remove the lining of your uterus.  °· Radiofrequency - This method uses a radiofrequency-alternating electric current to remove the lining of the uterus. °· Cryotherapy - This method uses extreme cold to freeze the lining of the uterus. °· Heated-Free Liquid - This method uses heated salt (saline) solution to remove the lining of the uterus. °· Microwave - This method uses high-energy microwaves to heat up the lining of the uterus to remove it. °· Thermal balloon - This method involves inserting a catheter with a balloon tip into the uterus. The balloon tip is filled with heated fluid to remove the lining of the uterus. °AFTER THE PROCEDURE  °After your procedure, do   not have sexual intercourse or insert anything into your vagina until permitted by your health care provider. After the procedure, you may experience: °· Cramps. °· Vaginal discharge. °· Frequent urination. °  °This information is not intended to replace advice given to you by your health care provider. Make sure you discuss any  questions you have with your health care provider. °  °Document Released: 07/13/2004 Document Revised: 05/25/2015 Document Reviewed: 02/04/2013 °Elsevier Interactive Patient Education ©2016 [employer] ° °

## 2016-05-17 ENCOUNTER — Encounter (HOSPITAL_COMMUNITY): Payer: Self-pay | Admitting: *Deleted

## 2016-05-22 ENCOUNTER — Encounter: Payer: Self-pay | Admitting: *Deleted

## 2016-06-05 ENCOUNTER — Ambulatory Visit: Admit: 2016-06-05 | Payer: BLUE CROSS/BLUE SHIELD | Admitting: Obstetrics & Gynecology

## 2016-06-05 SURGERY — DILATATION & CURETTAGE/HYSTEROSCOPY WITH HYDROTHERMAL ABLATION
Anesthesia: Choice | Site: Vagina

## 2016-07-16 DIAGNOSIS — E038 Other specified hypothyroidism: Secondary | ICD-10-CM | POA: Diagnosis not present

## 2016-07-16 DIAGNOSIS — Z0000 Encounter for general adult medical examination without abnormal findings: Secondary | ICD-10-CM | POA: Diagnosis not present

## 2016-07-23 DIAGNOSIS — E038 Other specified hypothyroidism: Secondary | ICD-10-CM | POA: Diagnosis not present

## 2016-07-23 DIAGNOSIS — Z0000 Encounter for general adult medical examination without abnormal findings: Secondary | ICD-10-CM | POA: Diagnosis not present

## 2016-07-23 DIAGNOSIS — Z1389 Encounter for screening for other disorder: Secondary | ICD-10-CM | POA: Diagnosis not present

## 2016-07-23 DIAGNOSIS — Z803 Family history of malignant neoplasm of breast: Secondary | ICD-10-CM | POA: Diagnosis not present

## 2016-07-23 DIAGNOSIS — Z85850 Personal history of malignant neoplasm of thyroid: Secondary | ICD-10-CM | POA: Diagnosis not present

## 2016-07-23 DIAGNOSIS — C73 Malignant neoplasm of thyroid gland: Secondary | ICD-10-CM | POA: Diagnosis not present

## 2016-08-01 DIAGNOSIS — D485 Neoplasm of uncertain behavior of skin: Secondary | ICD-10-CM | POA: Diagnosis not present

## 2016-08-01 DIAGNOSIS — Z872 Personal history of diseases of the skin and subcutaneous tissue: Secondary | ICD-10-CM | POA: Diagnosis not present

## 2016-08-01 DIAGNOSIS — L729 Follicular cyst of the skin and subcutaneous tissue, unspecified: Secondary | ICD-10-CM | POA: Diagnosis not present

## 2016-08-01 DIAGNOSIS — L820 Inflamed seborrheic keratosis: Secondary | ICD-10-CM | POA: Diagnosis not present

## 2016-08-01 DIAGNOSIS — L300 Nummular dermatitis: Secondary | ICD-10-CM | POA: Diagnosis not present

## 2016-08-01 DIAGNOSIS — L578 Other skin changes due to chronic exposure to nonionizing radiation: Secondary | ICD-10-CM | POA: Diagnosis not present

## 2016-09-29 DIAGNOSIS — Z23 Encounter for immunization: Secondary | ICD-10-CM | POA: Diagnosis not present

## 2016-10-16 DIAGNOSIS — L989 Disorder of the skin and subcutaneous tissue, unspecified: Secondary | ICD-10-CM | POA: Diagnosis not present

## 2016-10-16 DIAGNOSIS — Z85828 Personal history of other malignant neoplasm of skin: Secondary | ICD-10-CM | POA: Diagnosis not present

## 2016-10-16 DIAGNOSIS — L309 Dermatitis, unspecified: Secondary | ICD-10-CM | POA: Diagnosis not present

## 2016-10-29 DIAGNOSIS — C73 Malignant neoplasm of thyroid gland: Secondary | ICD-10-CM | POA: Diagnosis not present

## 2016-10-30 DIAGNOSIS — R938 Abnormal findings on diagnostic imaging of other specified body structures: Secondary | ICD-10-CM | POA: Diagnosis not present

## 2016-10-30 DIAGNOSIS — D251 Intramural leiomyoma of uterus: Secondary | ICD-10-CM | POA: Diagnosis not present

## 2016-10-30 DIAGNOSIS — C73 Malignant neoplasm of thyroid gland: Secondary | ICD-10-CM | POA: Diagnosis not present

## 2016-10-30 DIAGNOSIS — N951 Menopausal and female climacteric states: Secondary | ICD-10-CM | POA: Diagnosis not present

## 2016-11-02 DIAGNOSIS — R8290 Unspecified abnormal findings in urine: Secondary | ICD-10-CM | POA: Diagnosis not present

## 2016-11-02 DIAGNOSIS — C73 Malignant neoplasm of thyroid gland: Secondary | ICD-10-CM | POA: Diagnosis not present

## 2016-11-02 DIAGNOSIS — Z85850 Personal history of malignant neoplasm of thyroid: Secondary | ICD-10-CM | POA: Diagnosis not present

## 2016-11-30 ENCOUNTER — Telehealth: Payer: Self-pay | Admitting: *Deleted

## 2016-11-30 DIAGNOSIS — Z1231 Encounter for screening mammogram for malignant neoplasm of breast: Secondary | ICD-10-CM

## 2016-11-30 NOTE — Telephone Encounter (Signed)
[place] and receives her mammogram at a facility where she is located at this time. Called requesting an order for a mammogram.  Order placed.

## 2016-12-17 DIAGNOSIS — C73 Malignant neoplasm of thyroid gland: Secondary | ICD-10-CM | POA: Diagnosis not present

## 2016-12-17 DIAGNOSIS — Z9049 Acquired absence of other specified parts of digestive tract: Secondary | ICD-10-CM | POA: Diagnosis not present

## 2016-12-17 DIAGNOSIS — Z9089 Acquired absence of other organs: Secondary | ICD-10-CM | POA: Diagnosis not present

## 2016-12-17 DIAGNOSIS — Z85850 Personal history of malignant neoplasm of thyroid: Secondary | ICD-10-CM | POA: Diagnosis not present

## 2016-12-17 DIAGNOSIS — C770 Secondary and unspecified malignant neoplasm of lymph nodes of head, face and neck: Secondary | ICD-10-CM | POA: Diagnosis not present

## 2017-01-07 DIAGNOSIS — Z1231 Encounter for screening mammogram for malignant neoplasm of breast: Secondary | ICD-10-CM | POA: Diagnosis not present

## 2017-01-07 DIAGNOSIS — R921 Mammographic calcification found on diagnostic imaging of breast: Secondary | ICD-10-CM | POA: Diagnosis not present

## 2017-01-07 DIAGNOSIS — C73 Malignant neoplasm of thyroid gland: Secondary | ICD-10-CM | POA: Diagnosis not present

## 2017-01-09 DIAGNOSIS — R928 Other abnormal and inconclusive findings on diagnostic imaging of breast: Secondary | ICD-10-CM | POA: Diagnosis not present

## 2017-01-09 DIAGNOSIS — Z803 Family history of malignant neoplasm of breast: Secondary | ICD-10-CM | POA: Diagnosis not present

## 2017-01-09 DIAGNOSIS — R921 Mammographic calcification found on diagnostic imaging of breast: Secondary | ICD-10-CM | POA: Diagnosis not present

## 2017-01-09 DIAGNOSIS — R87810 Cervical high risk human papillomavirus (HPV) DNA test positive: Secondary | ICD-10-CM | POA: Diagnosis not present

## 2017-01-09 DIAGNOSIS — Z8041 Family history of malignant neoplasm of ovary: Secondary | ICD-10-CM | POA: Diagnosis not present

## 2017-01-09 DIAGNOSIS — Z01419 Encounter for gynecological examination (general) (routine) without abnormal findings: Secondary | ICD-10-CM | POA: Diagnosis not present

## 2017-01-10 DIAGNOSIS — R928 Other abnormal and inconclusive findings on diagnostic imaging of breast: Secondary | ICD-10-CM | POA: Diagnosis not present

## 2017-01-11 DIAGNOSIS — R920 Mammographic microcalcification found on diagnostic imaging of breast: Secondary | ICD-10-CM | POA: Diagnosis not present

## 2017-01-11 DIAGNOSIS — R921 Mammographic calcification found on diagnostic imaging of breast: Secondary | ICD-10-CM | POA: Diagnosis not present

## 2017-02-08 DIAGNOSIS — E039 Hypothyroidism, unspecified: Secondary | ICD-10-CM | POA: Diagnosis not present

## 2017-02-08 DIAGNOSIS — C73 Malignant neoplasm of thyroid gland: Secondary | ICD-10-CM | POA: Diagnosis not present

## 2017-02-08 DIAGNOSIS — R946 Abnormal results of thyroid function studies: Secondary | ICD-10-CM | POA: Diagnosis not present

## 2017-02-08 DIAGNOSIS — Z79899 Other long term (current) drug therapy: Secondary | ICD-10-CM | POA: Diagnosis not present

## 2017-03-01 DIAGNOSIS — J0100 Acute maxillary sinusitis, unspecified: Secondary | ICD-10-CM | POA: Diagnosis not present

## 2017-03-19 DIAGNOSIS — J0141 Acute recurrent pansinusitis: Secondary | ICD-10-CM | POA: Diagnosis not present

## 2017-03-26 DIAGNOSIS — R928 Other abnormal and inconclusive findings on diagnostic imaging of breast: Secondary | ICD-10-CM | POA: Diagnosis not present

## 2017-03-26 DIAGNOSIS — R921 Mammographic calcification found on diagnostic imaging of breast: Secondary | ICD-10-CM | POA: Diagnosis not present

## 2017-03-27 DIAGNOSIS — Z803 Family history of malignant neoplasm of breast: Secondary | ICD-10-CM | POA: Diagnosis not present

## 2017-03-27 DIAGNOSIS — Z85850 Personal history of malignant neoplasm of thyroid: Secondary | ICD-10-CM | POA: Diagnosis not present

## 2017-03-27 DIAGNOSIS — Z8042 Family history of malignant neoplasm of prostate: Secondary | ICD-10-CM | POA: Diagnosis not present

## 2017-03-27 DIAGNOSIS — Z9889 Other specified postprocedural states: Secondary | ICD-10-CM | POA: Diagnosis not present

## 2017-03-27 DIAGNOSIS — R928 Other abnormal and inconclusive findings on diagnostic imaging of breast: Secondary | ICD-10-CM | POA: Diagnosis not present

## 2017-04-05 DIAGNOSIS — N6081 Other benign mammary dysplasias of right breast: Secondary | ICD-10-CM | POA: Diagnosis not present

## 2017-04-05 DIAGNOSIS — R921 Mammographic calcification found on diagnostic imaging of breast: Secondary | ICD-10-CM | POA: Diagnosis not present

## 2017-04-05 DIAGNOSIS — R920 Mammographic microcalcification found on diagnostic imaging of breast: Secondary | ICD-10-CM | POA: Diagnosis not present

## 2017-04-05 DIAGNOSIS — N6021 Fibroadenosis of right breast: Secondary | ICD-10-CM | POA: Diagnosis not present

## 2017-04-05 DIAGNOSIS — R928 Other abnormal and inconclusive findings on diagnostic imaging of breast: Secondary | ICD-10-CM | POA: Diagnosis not present

## 2017-04-09 DIAGNOSIS — Z8042 Family history of malignant neoplasm of prostate: Secondary | ICD-10-CM | POA: Diagnosis not present

## 2017-04-09 DIAGNOSIS — Z8041 Family history of malignant neoplasm of ovary: Secondary | ICD-10-CM | POA: Diagnosis not present

## 2017-04-09 DIAGNOSIS — Z803 Family history of malignant neoplasm of breast: Secondary | ICD-10-CM | POA: Diagnosis not present

## 2017-04-15 DIAGNOSIS — R928 Other abnormal and inconclusive findings on diagnostic imaging of breast: Secondary | ICD-10-CM | POA: Diagnosis not present

## 2017-04-16 ENCOUNTER — Ambulatory Visit: Payer: BLUE CROSS/BLUE SHIELD | Admitting: Obstetrics & Gynecology

## 2017-04-22 DIAGNOSIS — Z6821 Body mass index (BMI) 21.0-21.9, adult: Secondary | ICD-10-CM | POA: Diagnosis not present

## 2017-04-22 DIAGNOSIS — Z8041 Family history of malignant neoplasm of ovary: Secondary | ICD-10-CM | POA: Diagnosis not present

## 2017-04-22 DIAGNOSIS — Z79899 Other long term (current) drug therapy: Secondary | ICD-10-CM | POA: Diagnosis not present

## 2017-04-22 DIAGNOSIS — Z8042 Family history of malignant neoplasm of prostate: Secondary | ICD-10-CM | POA: Diagnosis not present

## 2017-04-22 DIAGNOSIS — Z803 Family history of malignant neoplasm of breast: Secondary | ICD-10-CM | POA: Diagnosis not present

## 2017-04-22 DIAGNOSIS — Z01818 Encounter for other preprocedural examination: Secondary | ICD-10-CM | POA: Diagnosis not present

## 2017-04-22 DIAGNOSIS — R922 Inconclusive mammogram: Secondary | ICD-10-CM | POA: Diagnosis not present

## 2017-04-23 DIAGNOSIS — J310 Chronic rhinitis: Secondary | ICD-10-CM | POA: Diagnosis not present

## 2017-04-23 DIAGNOSIS — J069 Acute upper respiratory infection, unspecified: Secondary | ICD-10-CM | POA: Diagnosis not present

## 2017-04-23 DIAGNOSIS — J343 Hypertrophy of nasal turbinates: Secondary | ICD-10-CM | POA: Diagnosis not present

## 2017-04-23 DIAGNOSIS — R05 Cough: Secondary | ICD-10-CM | POA: Diagnosis not present

## 2017-04-25 ENCOUNTER — Ambulatory Visit: Payer: BLUE CROSS/BLUE SHIELD | Admitting: Obstetrics & Gynecology

## 2017-04-29 DIAGNOSIS — Z7189 Other specified counseling: Secondary | ICD-10-CM | POA: Diagnosis not present

## 2017-04-29 DIAGNOSIS — Z6820 Body mass index (BMI) 20.0-20.9, adult: Secondary | ICD-10-CM | POA: Diagnosis not present

## 2017-05-15 DIAGNOSIS — Z6820 Body mass index (BMI) 20.0-20.9, adult: Secondary | ICD-10-CM | POA: Diagnosis not present

## 2017-05-15 DIAGNOSIS — Z803 Family history of malignant neoplasm of breast: Secondary | ICD-10-CM | POA: Diagnosis not present

## 2017-05-17 ENCOUNTER — Ambulatory Visit: Payer: BLUE CROSS/BLUE SHIELD | Admitting: Obstetrics & Gynecology

## 2017-05-17 ENCOUNTER — Other Ambulatory Visit: Payer: Self-pay | Admitting: Internal Medicine

## 2017-05-17 DIAGNOSIS — Z8679 Personal history of other diseases of the circulatory system: Secondary | ICD-10-CM

## 2017-05-19 DIAGNOSIS — H10021 Other mucopurulent conjunctivitis, right eye: Secondary | ICD-10-CM | POA: Diagnosis not present

## 2017-05-19 DIAGNOSIS — Z23 Encounter for immunization: Secondary | ICD-10-CM | POA: Diagnosis not present

## 2017-05-22 DIAGNOSIS — R928 Other abnormal and inconclusive findings on diagnostic imaging of breast: Secondary | ICD-10-CM | POA: Diagnosis not present

## 2017-05-23 ENCOUNTER — Other Ambulatory Visit (HOSPITAL_COMMUNITY): Payer: BLUE CROSS/BLUE SHIELD

## 2017-05-27 DIAGNOSIS — Z1379 Encounter for other screening for genetic and chromosomal anomalies: Secondary | ICD-10-CM | POA: Diagnosis not present

## 2017-05-27 DIAGNOSIS — R928 Other abnormal and inconclusive findings on diagnostic imaging of breast: Secondary | ICD-10-CM | POA: Diagnosis not present

## 2017-06-06 ENCOUNTER — Other Ambulatory Visit: Payer: Self-pay

## 2017-06-06 ENCOUNTER — Ambulatory Visit (HOSPITAL_COMMUNITY): Payer: BLUE CROSS/BLUE SHIELD | Attending: Cardiology

## 2017-06-06 DIAGNOSIS — I340 Nonrheumatic mitral (valve) insufficiency: Secondary | ICD-10-CM | POA: Diagnosis not present

## 2017-06-06 DIAGNOSIS — I341 Nonrheumatic mitral (valve) prolapse: Secondary | ICD-10-CM | POA: Diagnosis not present

## 2017-06-06 DIAGNOSIS — Z8679 Personal history of other diseases of the circulatory system: Secondary | ICD-10-CM | POA: Diagnosis not present

## 2017-06-07 ENCOUNTER — Ambulatory Visit (INDEPENDENT_AMBULATORY_CARE_PROVIDER_SITE_OTHER): Payer: BLUE CROSS/BLUE SHIELD | Admitting: Obstetrics & Gynecology

## 2017-06-07 ENCOUNTER — Other Ambulatory Visit (HOSPITAL_COMMUNITY)
Admission: RE | Admit: 2017-06-07 | Discharge: 2017-06-07 | Disposition: A | Discharge status: Home / Self Care | Payer: BLUE CROSS/BLUE SHIELD | Source: Ambulatory Visit | Attending: Obstetrics & Gynecology | Admitting: Obstetrics & Gynecology

## 2017-06-07 VITALS — BP 109/70 | HR 55 | Wt 148.8 lb

## 2017-06-07 DIAGNOSIS — R938 Abnormal findings on diagnostic imaging of other specified body structures: Secondary | ICD-10-CM

## 2017-06-07 DIAGNOSIS — R9389 Abnormal findings on diagnostic imaging of other specified body structures: Secondary | ICD-10-CM

## 2017-06-07 DIAGNOSIS — Z1151 Encounter for screening for human papillomavirus (HPV): Secondary | ICD-10-CM

## 2017-06-07 DIAGNOSIS — Z124 Encounter for screening for malignant neoplasm of cervix: Secondary | ICD-10-CM | POA: Diagnosis not present

## 2017-06-07 DIAGNOSIS — N858 Other specified noninflammatory disorders of uterus: Secondary | ICD-10-CM | POA: Diagnosis not present

## 2017-06-07 NOTE — Patient Instructions (Signed)
Return to clinic for any scheduled appointments or for any gynecologic concerns as needed.   

## 2017-06-07 NOTE — Progress Notes (Signed)
GYNECOLOGY OFFICE VISIT NOTE  History:  56 y.o. G1P1001 here today for follow up for AUB.  No recent AUB, but had a pelvic ultrasound done in [place] (see report from [place] below) that showed a 5.5 cm thickened stripe. She requests endometrial sampling. Also has been seen several times in [place] for thyroid cancer surveillance. Had abnormal calcifications on mammogram and was followed by surgeon in [place], but now is followed at [facility]. She denies any abnormal vaginal discharge, bleeding, pelvic pain or other concerns.   Past Medical History:  Diagnosis Date  . Abnormal Pap smear   . Adenomyosis   . Fibrocystic breast   . Fibroids   . Mitral valve prolapse   . Thyroid cancer ([place])   . Vaginal Pap smear, abnormal     Past Surgical History:  Procedure Laterality Date  . BREAST BIOPSY     NUMEROUS TIMES  . CESAREAN SECTION    . CHOLECYSTECTOMY    . COLPOSCOPY    . fibriod removal  2002   uterine  . GALLBLADDER SURGERY  06/2009  . TOTAL THYROIDECTOMY  04/2000    The following portions of the patient's history were reviewed and updated as appropriate: allergies, current medications, past family history, past medical history, past social history, past surgical history and problem list.   Health Maintenance:  ASCUS pap and positive HRHPV on 10/29/2013 with negative colposcopy.   Review of Systems:  Pertinent items noted in HPI and remainder of comprehensive ROS otherwise negative.   Objective:  Physical Exam BP 109/70   Pulse (!) 55   Wt 148 lb 12.8 oz (67.5 kg)   BMI 20.75 kg/m  CONSTITUTIONAL: Well-developed, well-nourished female in no acute distress.  HENT:  Normocephalic, atraumatic. External right and left ear normal. Oropharynx is clear and moist EYES: Conjunctivae and EOM are normal. Pupils are equal, round, and reactive to light. No scleral icterus.  NECK: Normal range of motion, supple, no masses SKIN: Skin is warm and dry. No rash noted. Not diaphoretic. No  erythema. No pallor. NEUROLOGIC: Alert and oriented to person, place, and time. Normal reflexes, muscle tone coordination. No cranial nerve deficit noted. PSYCHIATRIC: Normal mood and affect. Normal behavior. Normal judgment and thought content. CARDIOVASCULAR: Normal heart rate noted RESPIRATORY: Effort and breath sounds normal, no problems with respiration noted ABDOMEN: Soft, no distention noted.   PELVIC: Normal appearing external genitalia; normal appearing vaginal mucosa with atrophy and cervix.  No abnormal discharge noted.  Small uterine size, no other palpable masses, no uterine or adnexal tenderness. MUSCULOSKELETAL: Normal range of motion. No edema noted.  ENDOMETRIAL BIOPSY     The indications for endometrial biopsy were reviewed.   Risks of the biopsy including cramping, bleeding, infection, uterine perforation, inadequate specimen and need for additional procedures  were discussed. The patient states she understands and agrees to undergo procedure today. Consent was signed. Time out was performed. During the pelvic exam, the cervix was prepped with Betadine. A single-toothed tenaculum was placed on the anterior lip of the cervix to stabilize it. The 3 mm pipelle was introduced into the endometrial cavity without difficulty to a depth of 5 cm, and a small amount of tissue was obtained and sent to pathology. The instruments were removed from the patient's vagina. Minimal bleeding from the cervix was noted. The patient tolerated the procedure well. Routine post-procedure instructions were given to the patient.     Labs and Imaging 10/30/2016 Pelvic Ultrasound Result Impression  Diffuse endometrial thickening measuring  up to 5.67mm, which could represent a neoplastic process in the setting of postmenopausal bleeding. Multiple small intramural fibroids.  RECOMMENDATIONS: Consider endometrial sampling if in the setting of postmenopausal bleeding.  TECHNIQUE: Transabdominal and  transvaginal ultrasound imaging of the pelvis was performed. Transvaginal imaging was performed to provide superior visualization of the endometrium and/or adnexa. 3D reconstructions were created and reviewed  COMPARISON: [place] SONOHYSTEROGRAM WITH TRANSVAGINAL 02/01/2016.  FINDINGS:  KIDNEYS: Unremarkable.  UTERUS: The endometrial echocomplex measures 5.54mm, which is diffusely thickened.The uterus measures 4.3 x 4.8 x 3.3 cm.Multiple small intramural fibroids are again seen, some of which are partially calcified. These measure up to 1.9cm and are not significantly changed.   OVARIES/ADNEXA: Unremarkable  PELVIS:No free fluid.     Assessment & Plan:  1. Thickened endometrium - Surgical pathology, will follow up results and manage accordingly. Bleeding precautions reviewed  2. Pap smear for cervical cancer screening - Cytology - PAP done today. Routine preventative health maintenance measures emphasized. Please refer to After Visit Summary for other counseling recommendations.   Return for any GYN concerns.   Total face-to-face time with patient: 25 minutes. Over 50% of encounter was spent on counseling and coordination of care.   Verita Schneiders, MD, [place] Attending [place], [facility] for [employer], [place]

## 2017-06-10 DIAGNOSIS — Z1501 Genetic susceptibility to malignant neoplasm of breast: Secondary | ICD-10-CM | POA: Diagnosis not present

## 2017-06-10 DIAGNOSIS — Z6820 Body mass index (BMI) 20.0-20.9, adult: Secondary | ICD-10-CM | POA: Diagnosis not present

## 2017-06-11 LAB — CYTOLOGY - PAP
DIAGNOSIS: NEGATIVE
HPV (WINDOPATH): NOT DETECTED

## 2017-06-12 ENCOUNTER — Encounter: Payer: Self-pay | Admitting: *Deleted

## 2017-06-12 DIAGNOSIS — R001 Bradycardia, unspecified: Secondary | ICD-10-CM | POA: Diagnosis not present

## 2017-06-12 DIAGNOSIS — R002 Palpitations: Secondary | ICD-10-CM | POA: Diagnosis not present

## 2017-06-12 DIAGNOSIS — I341 Nonrheumatic mitral (valve) prolapse: Secondary | ICD-10-CM | POA: Diagnosis not present

## 2017-06-13 DIAGNOSIS — C50919 Malignant neoplasm of unspecified site of unspecified female breast: Secondary | ICD-10-CM | POA: Diagnosis not present

## 2017-06-13 DIAGNOSIS — Z803 Family history of malignant neoplasm of breast: Secondary | ICD-10-CM | POA: Diagnosis not present

## 2017-06-13 DIAGNOSIS — R921 Mammographic calcification found on diagnostic imaging of breast: Secondary | ICD-10-CM | POA: Diagnosis not present

## 2017-06-27 ENCOUNTER — Encounter (HOSPITAL_COMMUNITY): Payer: Self-pay

## 2017-06-27 ENCOUNTER — Emergency Department (HOSPITAL_COMMUNITY)
Admission: EM | Admit: 2017-06-27 | Discharge: 2017-06-27 | Disposition: A | Discharge status: Home / Self Care | Payer: BLUE CROSS/BLUE SHIELD | Attending: Emergency Medicine | Admitting: Emergency Medicine

## 2017-06-27 DIAGNOSIS — R002 Palpitations: Secondary | ICD-10-CM | POA: Diagnosis not present

## 2017-06-27 DIAGNOSIS — Z85850 Personal history of malignant neoplasm of thyroid: Secondary | ICD-10-CM | POA: Insufficient documentation

## 2017-06-27 DIAGNOSIS — R079 Chest pain, unspecified: Secondary | ICD-10-CM | POA: Diagnosis not present

## 2017-06-27 LAB — CBC
HEMATOCRIT: 42.2 % (ref 36.0–46.0)
Hemoglobin: 14.2 g/dL (ref 12.0–15.0)
MCH: 31.1 pg (ref 26.0–34.0)
MCHC: 33.6 g/dL (ref 30.0–36.0)
MCV: 92.5 fL (ref 78.0–100.0)
PLATELETS: 244 10*3/uL (ref 150–400)
RBC: 4.56 MIL/uL (ref 3.87–5.11)
RDW: 12.7 % (ref 11.5–15.5)
WBC: 6 10*3/uL (ref 4.0–10.5)

## 2017-06-27 LAB — BASIC METABOLIC PANEL
Anion gap: 10 (ref 5–15)
BUN: 9 mg/dL (ref 6–20)
CO2: 30 mmol/L (ref 22–32)
Calcium: 9.9 mg/dL (ref 8.9–10.3)
Chloride: 97 mmol/L — ABNORMAL LOW (ref 101–111)
Creatinine, Ser: 0.71 mg/dL (ref 0.44–1.00)
GFR calc Af Amer: >60 mL/min (ref >60)
Glucose, Bld: 106 mg/dL — ABNORMAL HIGH (ref 65–99)
POTASSIUM: 3.2 mmol/L — AB (ref 3.5–5.1)
SODIUM: 137 mmol/L (ref 135–145)

## 2017-06-27 LAB — I-STAT TROPONIN, ED
TROPONIN I, POC: 0.01 ng/mL (ref 0.00–0.08)
Troponin i, poc: 0.02 ng/mL (ref 0.00–0.08)

## 2017-06-27 LAB — POC URINE PREG, ED: Preg Test, Ur: NEGATIVE

## 2017-06-27 LAB — D-DIMER, QUANTITATIVE (NOT AT ARMC)

## 2017-06-27 MED ORDER — POTASSIUM CHLORIDE CRYS ER 10 MEQ PO TBCR
10.0000 meq | EXTENDED_RELEASE_TABLET | Freq: Once | ORAL | Status: AC
  Administered 2017-06-27: 10 meq via ORAL
  Filled 2017-06-27: qty 1

## 2017-06-27 NOTE — Discharge Instructions (Signed)
Follow-up with her primary care doctor in next 2-4 days for further evaluation.  Follow-up with referred cardiologist. Arrange for an appointment in the next few days. Tell them you were seen in the Emergency Department.   Return the emergency Department for any worsening palpitations, chest pain, difficulty breathing, fevers, vomiting or any other worsening or concerning symptoms.

## 2017-06-27 NOTE — ED Triage Notes (Signed)
Pt presents for evaluation of L sided CP below breast with lightheadedness. Pt reports started today, hx of mitral valve prolapse and thyroidectomy for thyroid CA. Pt reports blood pressure cuff at home stated she had Afib and a low heart rate.

## 2017-06-27 NOTE — ED Provider Notes (Signed)
Jessica Stafford Provider Note   CSN: [id number] Arrival date & time: 06/27/17  1310     History   Chief Complaint Chief Complaint  Patient presents with  . Chest Pain    HPI Jessica Stafford is a 56 y.o. female with PMH/o MVP, Thyroid cancer who presents with intermittent palpitations and left sided CP that began today at approximately 1130. Patient reports that she has had intermittent palpitations for the last month and a half. She states that today, she started expressing left-sided chest pain and pain that went into her back approximately 1:30. Patient states that she did not take any medications for the symptoms. Patient reports that on ED arrival, the pain is completely resolved. Patient reports that pain was not worse with deep inspiration or worse with exertion. She does report feeling slightly nauseous denies any diaphoresis or vomiting. Patient reports that she is currently undergoing evaluation for potential thyroid cancer and was seen by her doctor in [place]. Patient reports at that time, she was initially complaining of symptoms and he arranged for her to have an EKG and an echo. Patient had the results but has not been followed up by cardiologist. Patient states she has been diagnosed with bradycardia. Patient states that she does not smoke. Patient reports that no PE and her family had MIs but does report there is extensive valve history. She denies any OCP use, recent immobilization, prior history of DVT/PE, recent surgery, leg swelling, or long travel. Patient states that she does not have a current active diagnosis of cancer but states she is being evaluated for possible recurrence of her thyroid cancer. Patient's denies any fevers, chills, shortness of breath, abdominal pain, vomiting, dysuria, hematuria.   The history is provided by the patient.    Past Medical History:  Diagnosis Date  . Abnormal Pap smear   . Adenomyosis   . Fibrocystic breast   .  Fibroids   . Mitral valve prolapse   . Thyroid cancer ([place])   . Vaginal Pap smear, abnormal     Patient Active Problem List   Diagnosis Date Noted  . Abnormal uterine bleeding (AUB) 05/16/2016  . Family history of cancers 03/14/2013  . Mitral valve prolapse   . Thyroid cancer ([place])   . Fibrocystic breast   . Fibroids     Past Surgical History:  Procedure Laterality Date  . BREAST BIOPSY     NUMEROUS TIMES  . CESAREAN SECTION    . CHOLECYSTECTOMY    . COLPOSCOPY    . fibriod removal  2002   uterine  . GALLBLADDER SURGERY  06/2009  . TOTAL THYROIDECTOMY  04/2000    OB History    Gravida Para Term Preterm AB Living   1 1 1     1    SAB TAB Ectopic Multiple Live Births           1       Home Medications    Prior to Admission medications   Medication Sig Start Date End Date Taking? Authorizing Provider  Calcium Citrate-Vitamin D (CALCIUM + D PO) Take 1 tablet by mouth as needed (low calcium).    Yes [provider]  ibuprofen (ADVIL) 200 MG tablet Take 400 mg by mouth every 6 (six) hours as needed (pain).    Yes [provider]  levothyroxine (LEVOXYL) 175 MCG tablet Take 175 mcg by mouth daily.    Yes [provider]  magnesium 30 MG tablet Take 30 mg  by mouth as needed.    Yes [provider]  Multiple Vitamin (MULTIVITAMIN) tablet Take 1 tablet by mouth as needed.    Yes [provider]  Omega-3 Fatty Acids (OMEGA 3 PO) Take 1 capsule by mouth as needed.    Yes [provider]  Thiamine Mononitrate (VITAMIN B1 PO) Take 1 tablet by mouth as needed.   Yes [provider]  medroxyPROGESTERone (PROVERA) 10 MG tablet Take 1 tablet (10 mg total) by mouth daily. Use for ten days Patient not taking: Reported on 06/07/2017 08/17/15   Emily Filbert, MD  megestrol (MEGACE) 40 MG tablet Take 1 tablet (40 mg total) by mouth 2 (two) times daily. Patient not taking: Reported on 05/15/2016 07/26/15   Emily Filbert, MD     Family History Family History  Problem Relation Age of Onset  . Hypertension Mother   . Hypothyroidism Mother   . Heart disease Mother        mitral valve prolaspe  . Heart disease Father   . Hypertension Father   . Cancer Father        prostate  . Melanoma Brother   . Valvular heart disease Maternal Grandmother   . Diabetes Paternal Grandmother   . Cancer Paternal Grandmother        colon and ovarian  . Melanoma Brother   . Valvular heart disease Brother   . Valvular heart disease Brother     Social History Social History  Substance Use Topics  . Smoking status: Never Smoker  . Smokeless tobacco: Not on file  . Alcohol use No     Allergies   Patient has no known allergies.   Review of Systems Review of Systems  Constitutional: Negative for chills and fever.  Eyes: Negative for visual disturbance.  Respiratory: Negative for cough and shortness of breath.   Cardiovascular: Positive for chest pain and palpitations.  Gastrointestinal: Negative for abdominal pain, nausea and vomiting.  Genitourinary: Negative for dysuria and hematuria.  Musculoskeletal: Negative for back pain and neck pain.  Neurological: Negative for weakness and numbness.     Physical Exam Updated Vital Signs BP (!) 142/85   Pulse (!) 52   Temp 97.8 F (36.6 C) (Oral)   Resp 18   SpO2 100%   Physical Exam  Constitutional: She is oriented to person, place, and time. She appears well-developed and well-nourished.  Sitting comfortably on examination table  HENT:  Head: Normocephalic and atraumatic.  Mouth/Throat: Oropharynx is clear and moist and mucous membranes are normal.  Eyes: Pupils are equal, round, and reactive to light. Conjunctivae, EOM and lids are normal.  Neck: Full passive range of motion without pain.  Cardiovascular: Regular rhythm, normal heart sounds and normal pulses.  Bradycardia present.  Exam reveals no gallop and no friction rub.   No murmur  heard. Pulmonary/Chest: Effort normal and breath sounds normal.  No evidence of respiratory distress. Able to speak in full sentences without difficulty.  Abdominal: Soft. Normal appearance. There is no tenderness. There is no rigidity and no guarding.  Musculoskeletal: Normal range of motion.  Neurological: She is alert and oriented to person, place, and time.  Skin: Skin is warm and dry. Capillary refill takes less than 2 seconds.  Psychiatric: She has a normal mood and affect. Her speech is normal.  Nursing note and vitals reviewed.    ED Treatments / Results  Labs (all labs ordered are listed, but only abnormal results are displayed) Labs Reviewed  BASIC METABOLIC PANEL - Abnormal; Notable for the following:       Result Value   Potassium 3.2 (*)    Chloride 97 (*)    Glucose, Bld 106 (*)    All other components within normal limits  CBC  D-DIMER, QUANTITATIVE (NOT AT [facility])  I-STAT TROPONIN, ED  POC URINE PREG, ED  I-STAT TROPONIN, ED    EKG  EKG Interpretation  Date/Time:  Thursday June 27 2017 13:18:06 EDT Ventricular Rate:  74 PR Interval:  138 QRS Duration: 86 QT Interval:  424 QTC Calculation: 470 R Axis:   45 Text Interpretation:  Sinus rhythm with frequent Premature ventricular complexes Otherwise normal ECG No significant change since last tracing Confirmed by Merrily Pew [phone]) on 06/27/2017 4:37:05 PM       Radiology No results found.  Procedures Procedures (including critical care time)  Medications Ordered in ED Medications  potassium chloride (K-DUR,KLOR-CON) CR tablet 10 mEq (10 mEq Oral Given 06/27/17 1717)     Initial Impression / Assessment and Plan / ED Course  I have reviewed the triage vital signs and the nursing notes.  Pertinent labs & imaging results that were available during my care of the patient were reviewed by me and considered in my medical decision making (see chart for details).     56 y.o. F with PMH/o Thyroid  cancer who presents with palpitations and chest pain that began today at 1130. Has a history of month long intermittent palpitations and had an outpatient EKG and echo done at [facility]. Has not had cardiology follow-up. Patient is afebrile, non-toxic appearing, sitting comfortably on examination table. Vital signs reviewed and stable. Consider ACS etiology vs musculoskeletal vs acute infectious etiology vs electrolyte imbalnce vs thyroid abnormality. Initial labs ordered at triage. Will plan to add CXR, D-imer. Offered analgesics but patient declines at this time since she is not having any pain. Discussed with patient. She does not wish to have a chest x-ray at this time. Discussed risks vs benefits of obtaining a CXR. Patient expresses full understanding of risks vs benefits and declines at this time.   Records reviewed. Patient had an echo performed on 06/13/17 that showed normal ejection fraction. Patient had some mild tricuspid regurg. Otherwise unremarkable. She has an EKG that shows sinus brady. Patient reports that she has been diagnosed with bradycardia.   Labs reviewed. BMP shows slight hypokalemia at 3.2. PO potassium given in the department. I-STAT troponin is negative. CBC unremarkable. EKG shows Sinus rhythm with frequent PVCs. Discussed results with patient. During discussion, cardiac monitoring shows presence of PVCs but they are not sustained. Repeat Trop and D-Dimer pending   Repeat troponin negative. D-Dimer negative. With two negative troponins, doubt ACS etiology.  Discussed with patient. She still denies any pain. She needs to be evaluated by outpatient cardiology with eventual holter monitor placement for record of palpitations. Patient is located in the [place] area. Will plan to provide outpatient cardiology referral for patient to follow-up with. Instructed her to call and arrange an appointment. She is scheduled to see her PCP tomorrow for evaluation of thyroid function. Encouraged  her to keep that appointment. Strict return precautions discussed. Patient expresses understanding and agreement to plan.    Final Clinical Impressions(s) / ED Diagnoses   Final diagnoses:  Chest pain, unspecified type  Palpitations    New Prescriptions Discharge Medication List as of 06/27/2017  7:00 PM       Volanda Napoleon, PA-C 06/28/17 2238  Merrily Pew, MD 07/01/17 [phone]

## 2017-07-12 DIAGNOSIS — M50823 Other cervical disc disorders at C6-C7 level: Secondary | ICD-10-CM | POA: Diagnosis not present

## 2017-07-12 DIAGNOSIS — M50822 Other cervical disc disorders at C5-C6 level: Secondary | ICD-10-CM | POA: Diagnosis not present

## 2017-07-31 ENCOUNTER — Other Ambulatory Visit: Payer: Self-pay | Admitting: Physical Medicine and Rehabilitation

## 2017-07-31 DIAGNOSIS — M542 Cervicalgia: Secondary | ICD-10-CM

## 2017-07-31 DIAGNOSIS — M545 Low back pain, unspecified: Secondary | ICD-10-CM

## 2017-08-06 DIAGNOSIS — M5442 Lumbago with sciatica, left side: Secondary | ICD-10-CM | POA: Diagnosis not present

## 2017-08-11 ENCOUNTER — Ambulatory Visit
Admission: RE | Admit: 2017-08-11 | Discharge: 2017-08-11 | Disposition: A | Discharge status: Home / Self Care | Payer: BLUE CROSS/BLUE SHIELD | Source: Ambulatory Visit | Attending: Physical Medicine and Rehabilitation | Admitting: Physical Medicine and Rehabilitation

## 2017-08-11 DIAGNOSIS — M48061 Spinal stenosis, lumbar region without neurogenic claudication: Secondary | ICD-10-CM | POA: Diagnosis not present

## 2017-08-11 DIAGNOSIS — M542 Cervicalgia: Secondary | ICD-10-CM

## 2017-08-11 DIAGNOSIS — M50223 Other cervical disc displacement at C6-C7 level: Secondary | ICD-10-CM | POA: Diagnosis not present

## 2017-08-11 DIAGNOSIS — M545 Low back pain, unspecified: Secondary | ICD-10-CM

## 2017-08-12 DIAGNOSIS — K5900 Constipation, unspecified: Secondary | ICD-10-CM | POA: Diagnosis not present

## 2017-08-12 DIAGNOSIS — R339 Retention of urine, unspecified: Secondary | ICD-10-CM | POA: Diagnosis not present

## 2017-08-12 DIAGNOSIS — M5432 Sciatica, left side: Secondary | ICD-10-CM | POA: Diagnosis not present

## 2017-08-12 DIAGNOSIS — R200 Anesthesia of skin: Secondary | ICD-10-CM | POA: Diagnosis not present

## 2017-08-16 DIAGNOSIS — M5136 Other intervertebral disc degeneration, lumbar region: Secondary | ICD-10-CM | POA: Diagnosis not present

## 2017-08-16 DIAGNOSIS — M5126 Other intervertebral disc displacement, lumbar region: Secondary | ICD-10-CM | POA: Diagnosis not present

## 2017-08-16 DIAGNOSIS — M5416 Radiculopathy, lumbar region: Secondary | ICD-10-CM | POA: Diagnosis not present

## 2017-08-20 DIAGNOSIS — M5442 Lumbago with sciatica, left side: Secondary | ICD-10-CM | POA: Diagnosis not present

## 2017-08-20 DIAGNOSIS — M542 Cervicalgia: Secondary | ICD-10-CM | POA: Diagnosis not present

## 2017-08-21 DIAGNOSIS — R0602 Shortness of breath: Secondary | ICD-10-CM | POA: Diagnosis not present

## 2017-08-21 DIAGNOSIS — J069 Acute upper respiratory infection, unspecified: Secondary | ICD-10-CM | POA: Diagnosis not present

## 2017-08-21 DIAGNOSIS — R062 Wheezing: Secondary | ICD-10-CM | POA: Diagnosis not present

## 2017-08-21 DIAGNOSIS — M5440 Lumbago with sciatica, unspecified side: Secondary | ICD-10-CM | POA: Diagnosis not present

## 2017-08-23 DIAGNOSIS — M542 Cervicalgia: Secondary | ICD-10-CM | POA: Diagnosis not present

## 2017-08-23 DIAGNOSIS — M5442 Lumbago with sciatica, left side: Secondary | ICD-10-CM | POA: Diagnosis not present

## 2017-09-24 ENCOUNTER — Encounter (HOSPITAL_COMMUNITY): Payer: Self-pay | Admitting: *Deleted

## 2017-09-24 ENCOUNTER — Emergency Department (HOSPITAL_COMMUNITY)
Admission: EM | Admit: 2017-09-24 | Discharge: 2017-09-24 | Disposition: A | Discharge status: Home / Self Care | Payer: BLUE CROSS/BLUE SHIELD | Attending: Emergency Medicine | Admitting: Emergency Medicine

## 2017-09-24 ENCOUNTER — Other Ambulatory Visit: Payer: Self-pay

## 2017-09-24 DIAGNOSIS — R05 Cough: Secondary | ICD-10-CM | POA: Insufficient documentation

## 2017-09-24 DIAGNOSIS — Z9109 Other allergy status, other than to drugs and biological substances: Secondary | ICD-10-CM | POA: Diagnosis not present

## 2017-09-24 DIAGNOSIS — J069 Acute upper respiratory infection, unspecified: Secondary | ICD-10-CM | POA: Diagnosis not present

## 2017-09-24 DIAGNOSIS — Z5321 Procedure and treatment not carried out due to patient leaving prior to being seen by health care provider: Secondary | ICD-10-CM | POA: Diagnosis not present

## 2017-09-24 NOTE — ED Notes (Signed)
Pt states she feels fine and thinks she'll be okay. States, "you guys look pretty busy. Im going to go." Pt seen leaving ED with steady gait.

## 2017-09-24 NOTE — ED Triage Notes (Signed)
Pt reports an ongoing cough and URI symptoms since before christmas. Pt states that her cough has continued. Pt states that she had seen a dr when she started feeling bad but did not take the antibiotic.

## 2017-09-25 DIAGNOSIS — M542 Cervicalgia: Secondary | ICD-10-CM | POA: Diagnosis not present

## 2017-10-01 DIAGNOSIS — M542 Cervicalgia: Secondary | ICD-10-CM | POA: Diagnosis not present

## 2017-10-08 DIAGNOSIS — M542 Cervicalgia: Secondary | ICD-10-CM | POA: Diagnosis not present

## 2017-10-09 DIAGNOSIS — J00 Acute nasopharyngitis [common cold]: Secondary | ICD-10-CM | POA: Diagnosis not present

## 2017-10-09 DIAGNOSIS — M791 Myalgia, unspecified site: Secondary | ICD-10-CM | POA: Diagnosis not present

## 2017-10-24 DIAGNOSIS — E038 Other specified hypothyroidism: Secondary | ICD-10-CM | POA: Diagnosis not present

## 2017-10-24 DIAGNOSIS — R82998 Other abnormal findings in urine: Secondary | ICD-10-CM | POA: Diagnosis not present

## 2017-10-24 DIAGNOSIS — Z0000 Encounter for general adult medical examination without abnormal findings: Secondary | ICD-10-CM | POA: Diagnosis not present

## 2017-10-30 DIAGNOSIS — M47816 Spondylosis without myelopathy or radiculopathy, lumbar region: Secondary | ICD-10-CM | POA: Diagnosis not present

## 2017-10-30 DIAGNOSIS — R030 Elevated blood-pressure reading, without diagnosis of hypertension: Secondary | ICD-10-CM | POA: Diagnosis not present

## 2017-10-31 DIAGNOSIS — Z0000 Encounter for general adult medical examination without abnormal findings: Secondary | ICD-10-CM | POA: Diagnosis not present

## 2017-10-31 DIAGNOSIS — Z85850 Personal history of malignant neoplasm of thyroid: Secondary | ICD-10-CM | POA: Diagnosis not present

## 2017-10-31 DIAGNOSIS — Z1389 Encounter for screening for other disorder: Secondary | ICD-10-CM | POA: Diagnosis not present

## 2017-10-31 DIAGNOSIS — I340 Nonrheumatic mitral (valve) insufficiency: Secondary | ICD-10-CM | POA: Diagnosis not present

## 2017-10-31 DIAGNOSIS — E038 Other specified hypothyroidism: Secondary | ICD-10-CM | POA: Diagnosis not present

## 2017-10-31 DIAGNOSIS — M545 Low back pain: Secondary | ICD-10-CM | POA: Diagnosis not present

## 2017-11-13 DIAGNOSIS — I341 Nonrheumatic mitral (valve) prolapse: Secondary | ICD-10-CM | POA: Diagnosis not present

## 2017-11-19 DIAGNOSIS — M5416 Radiculopathy, lumbar region: Secondary | ICD-10-CM | POA: Diagnosis not present

## 2017-12-17 ENCOUNTER — Other Ambulatory Visit (INDEPENDENT_AMBULATORY_CARE_PROVIDER_SITE_OTHER): Payer: Self-pay | Admitting: Otolaryngology

## 2017-12-17 DIAGNOSIS — D3705 Neoplasm of uncertain behavior of pharynx: Secondary | ICD-10-CM | POA: Diagnosis not present

## 2017-12-17 DIAGNOSIS — J358 Other chronic diseases of tonsils and adenoids: Secondary | ICD-10-CM | POA: Diagnosis not present

## 2018-02-15 HISTORY — PX: MASTECTOMY: SHX3

## 2018-03-06 DIAGNOSIS — R509 Fever, unspecified: Secondary | ICD-10-CM | POA: Diagnosis not present

## 2018-03-06 DIAGNOSIS — Z1501 Genetic susceptibility to malignant neoplasm of breast: Secondary | ICD-10-CM | POA: Diagnosis not present

## 2018-03-06 DIAGNOSIS — Z803 Family history of malignant neoplasm of breast: Secondary | ICD-10-CM | POA: Diagnosis not present

## 2018-03-06 DIAGNOSIS — N62 Hypertrophy of breast: Secondary | ICD-10-CM | POA: Diagnosis not present

## 2018-03-06 DIAGNOSIS — G8918 Other acute postprocedural pain: Secondary | ICD-10-CM | POA: Diagnosis not present

## 2018-03-06 DIAGNOSIS — Z9013 Acquired absence of bilateral breasts and nipples: Secondary | ICD-10-CM | POA: Diagnosis not present

## 2018-03-06 DIAGNOSIS — Z4001 Encounter for prophylactic removal of breast: Secondary | ICD-10-CM | POA: Diagnosis not present

## 2018-03-06 DIAGNOSIS — D241 Benign neoplasm of right breast: Secondary | ICD-10-CM | POA: Diagnosis not present

## 2018-03-06 DIAGNOSIS — Z85850 Personal history of malignant neoplasm of thyroid: Secondary | ICD-10-CM | POA: Diagnosis not present

## 2018-03-06 DIAGNOSIS — K219 Gastro-esophageal reflux disease without esophagitis: Secondary | ICD-10-CM | POA: Diagnosis not present

## 2018-03-06 DIAGNOSIS — Z9049 Acquired absence of other specified parts of digestive tract: Secondary | ICD-10-CM | POA: Diagnosis not present

## 2018-03-06 DIAGNOSIS — N6489 Other specified disorders of breast: Secondary | ICD-10-CM | POA: Diagnosis not present

## 2018-03-06 DIAGNOSIS — Z85828 Personal history of other malignant neoplasm of skin: Secondary | ICD-10-CM | POA: Diagnosis not present

## 2018-03-08 DIAGNOSIS — Z803 Family history of malignant neoplasm of breast: Secondary | ICD-10-CM | POA: Diagnosis not present

## 2018-03-08 DIAGNOSIS — Z9013 Acquired absence of bilateral breasts and nipples: Secondary | ICD-10-CM | POA: Diagnosis not present

## 2018-03-08 DIAGNOSIS — Z4889 Encounter for other specified surgical aftercare: Secondary | ICD-10-CM | POA: Diagnosis not present

## 2018-03-08 DIAGNOSIS — C50911 Malignant neoplasm of unspecified site of right female breast: Secondary | ICD-10-CM | POA: Diagnosis not present

## 2018-03-09 DIAGNOSIS — Z9013 Acquired absence of bilateral breasts and nipples: Secondary | ICD-10-CM | POA: Diagnosis not present

## 2018-03-09 DIAGNOSIS — Z803 Family history of malignant neoplasm of breast: Secondary | ICD-10-CM | POA: Diagnosis not present

## 2018-03-09 DIAGNOSIS — C50911 Malignant neoplasm of unspecified site of right female breast: Secondary | ICD-10-CM | POA: Diagnosis not present

## 2018-03-09 DIAGNOSIS — Z4889 Encounter for other specified surgical aftercare: Secondary | ICD-10-CM | POA: Diagnosis not present

## 2018-03-11 DIAGNOSIS — Z803 Family history of malignant neoplasm of breast: Secondary | ICD-10-CM | POA: Diagnosis not present

## 2018-03-11 DIAGNOSIS — Z4889 Encounter for other specified surgical aftercare: Secondary | ICD-10-CM | POA: Diagnosis not present

## 2018-03-11 DIAGNOSIS — C50911 Malignant neoplasm of unspecified site of right female breast: Secondary | ICD-10-CM | POA: Diagnosis not present

## 2018-03-11 DIAGNOSIS — Z9013 Acquired absence of bilateral breasts and nipples: Secondary | ICD-10-CM | POA: Diagnosis not present

## 2018-03-11 DIAGNOSIS — C73 Malignant neoplasm of thyroid gland: Secondary | ICD-10-CM | POA: Diagnosis not present

## 2018-03-12 DIAGNOSIS — C73 Malignant neoplasm of thyroid gland: Secondary | ICD-10-CM | POA: Diagnosis not present

## 2018-03-12 DIAGNOSIS — C50919 Malignant neoplasm of unspecified site of unspecified female breast: Secondary | ICD-10-CM | POA: Diagnosis not present

## 2018-04-30 DIAGNOSIS — L821 Other seborrheic keratosis: Secondary | ICD-10-CM | POA: Diagnosis not present

## 2018-04-30 DIAGNOSIS — L814 Other melanin hyperpigmentation: Secondary | ICD-10-CM | POA: Diagnosis not present

## 2018-04-30 DIAGNOSIS — D1801 Hemangioma of skin and subcutaneous tissue: Secondary | ICD-10-CM | POA: Diagnosis not present

## 2018-04-30 DIAGNOSIS — D2270 Melanocytic nevi of unspecified lower limb, including hip: Secondary | ICD-10-CM | POA: Diagnosis not present

## 2018-05-28 DIAGNOSIS — Z0000 Encounter for general adult medical examination without abnormal findings: Secondary | ICD-10-CM | POA: Diagnosis not present

## 2018-05-28 DIAGNOSIS — R82998 Other abnormal findings in urine: Secondary | ICD-10-CM | POA: Diagnosis not present

## 2018-05-28 DIAGNOSIS — R350 Frequency of micturition: Secondary | ICD-10-CM | POA: Diagnosis not present

## 2018-05-28 DIAGNOSIS — E038 Other specified hypothyroidism: Secondary | ICD-10-CM | POA: Diagnosis not present

## 2018-06-03 DIAGNOSIS — I340 Nonrheumatic mitral (valve) insufficiency: Secondary | ICD-10-CM | POA: Diagnosis not present

## 2018-06-03 DIAGNOSIS — Z0000 Encounter for general adult medical examination without abnormal findings: Secondary | ICD-10-CM | POA: Diagnosis not present

## 2018-06-03 DIAGNOSIS — Z85850 Personal history of malignant neoplasm of thyroid: Secondary | ICD-10-CM | POA: Diagnosis not present

## 2018-06-03 DIAGNOSIS — E038 Other specified hypothyroidism: Secondary | ICD-10-CM | POA: Diagnosis not present

## 2018-06-03 DIAGNOSIS — Z1389 Encounter for screening for other disorder: Secondary | ICD-10-CM | POA: Diagnosis not present

## 2018-06-04 DIAGNOSIS — Z1212 Encounter for screening for malignant neoplasm of rectum: Secondary | ICD-10-CM | POA: Diagnosis not present

## 2018-06-30 DIAGNOSIS — H10021 Other mucopurulent conjunctivitis, right eye: Secondary | ICD-10-CM | POA: Diagnosis not present

## 2018-07-24 DIAGNOSIS — C73 Malignant neoplasm of thyroid gland: Secondary | ICD-10-CM | POA: Diagnosis not present

## 2018-07-25 DIAGNOSIS — Z79890 Hormone replacement therapy (postmenopausal): Secondary | ICD-10-CM | POA: Diagnosis not present

## 2018-07-25 DIAGNOSIS — C73 Malignant neoplasm of thyroid gland: Secondary | ICD-10-CM | POA: Diagnosis not present

## 2018-07-25 DIAGNOSIS — R590 Localized enlarged lymph nodes: Secondary | ICD-10-CM | POA: Diagnosis not present

## 2018-07-25 DIAGNOSIS — Z85850 Personal history of malignant neoplasm of thyroid: Secondary | ICD-10-CM | POA: Diagnosis not present

## 2018-07-25 DIAGNOSIS — E890 Postprocedural hypothyroidism: Secondary | ICD-10-CM | POA: Diagnosis not present

## 2018-07-25 DIAGNOSIS — Z8349 Family history of other endocrine, nutritional and metabolic diseases: Secondary | ICD-10-CM | POA: Diagnosis not present
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
## 2018-11-30 DIAGNOSIS — R05 Cough: Secondary | ICD-10-CM | POA: Diagnosis not present

## 2019-05-07 ENCOUNTER — Other Ambulatory Visit: Payer: Self-pay | Admitting: Obstetrics and Gynecology

## 2019-05-07 DIAGNOSIS — Z809 Family history of malignant neoplasm, unspecified: Secondary | ICD-10-CM

## 2019-05-08 ENCOUNTER — Telehealth: Payer: Self-pay | Admitting: Radiology

## 2019-05-08 NOTE — Telephone Encounter (Signed)
Left message for patient to call [id number], need to know where she wants transvaginal U/S done.

## 2019-05-18 ENCOUNTER — Ambulatory Visit (INDEPENDENT_AMBULATORY_CARE_PROVIDER_SITE_OTHER): Payer: BC Managed Care – PPO

## 2019-05-18 ENCOUNTER — Other Ambulatory Visit: Payer: Self-pay

## 2019-05-18 VITALS — BP 123/79

## 2019-05-18 DIAGNOSIS — R300 Dysuria: Secondary | ICD-10-CM | POA: Diagnosis not present

## 2019-05-18 DIAGNOSIS — H018 Other specified inflammations of eyelid: Secondary | ICD-10-CM | POA: Diagnosis not present

## 2019-05-18 DIAGNOSIS — H04123 Dry eye syndrome of bilateral lacrimal glands: Secondary | ICD-10-CM | POA: Diagnosis not present

## 2019-05-18 LAB — POCT URINE QUALITATIVE DIPSTICK BLOOD: Blood, UA: NORMAL
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
## 2019-06-02 DIAGNOSIS — Z0000 Encounter for general adult medical examination without abnormal findings: Secondary | ICD-10-CM | POA: Diagnosis not present

## 2019-06-02 DIAGNOSIS — E038 Other specified hypothyroidism: Secondary | ICD-10-CM | POA: Diagnosis not present

## 2019-06-03 ENCOUNTER — Other Ambulatory Visit: Payer: Self-pay

## 2019-06-03 ENCOUNTER — Ambulatory Visit (HOSPITAL_COMMUNITY)
Admission: RE | Admit: 2019-06-03 | Discharge: 2019-06-03 | Disposition: A | Discharge status: Home / Self Care | Payer: BC Managed Care – PPO | Source: Ambulatory Visit | Attending: Obstetrics and Gynecology | Admitting: Obstetrics and Gynecology

## 2019-06-03 DIAGNOSIS — Z809 Family history of malignant neoplasm, unspecified: Secondary | ICD-10-CM | POA: Insufficient documentation

## 2019-06-03 DIAGNOSIS — D259 Leiomyoma of uterus, unspecified: Secondary | ICD-10-CM | POA: Diagnosis not present

## 2019-06-09 ENCOUNTER — Ambulatory Visit: Payer: BLUE CROSS/BLUE SHIELD | Admitting: Obstetrics & Gynecology

## 2019-06-09 DIAGNOSIS — Z85850 Personal history of malignant neoplasm of thyroid: Secondary | ICD-10-CM | POA: Diagnosis not present

## 2019-06-09 DIAGNOSIS — E038 Other specified hypothyroidism: Secondary | ICD-10-CM | POA: Diagnosis not present

## 2019-06-09 DIAGNOSIS — G4700 Insomnia, unspecified: Secondary | ICD-10-CM | POA: Diagnosis not present

## 2019-06-09 DIAGNOSIS — R140 Abdominal distension (gaseous): Secondary | ICD-10-CM | POA: Diagnosis not present

## 2019-06-09 DIAGNOSIS — Z0000 Encounter for general adult medical examination without abnormal findings: Secondary | ICD-10-CM | POA: Diagnosis not present

## 2019-06-09 DIAGNOSIS — C73 Malignant neoplasm of thyroid gland: Secondary | ICD-10-CM | POA: Diagnosis not present

## 2019-06-09 DIAGNOSIS — N3281 Overactive bladder: Secondary | ICD-10-CM | POA: Diagnosis not present

## 2019-06-12 ENCOUNTER — Other Ambulatory Visit: Payer: Self-pay | Admitting: Internal Medicine

## 2019-06-12 DIAGNOSIS — R140 Abdominal distension (gaseous): Secondary | ICD-10-CM

## 2019-06-18 ENCOUNTER — Other Ambulatory Visit: Payer: BC Managed Care – PPO

## 2019-06-23 ENCOUNTER — Ambulatory Visit
Admission: RE | Admit: 2019-06-23 | Discharge: 2019-06-23 | Disposition: A | Discharge status: Home / Self Care | Payer: BC Managed Care – PPO | Source: Ambulatory Visit | Attending: Internal Medicine | Admitting: Internal Medicine

## 2019-06-23 DIAGNOSIS — K5900 Constipation, unspecified: Secondary | ICD-10-CM | POA: Diagnosis not present

## 2019-06-23 DIAGNOSIS — R140 Abdominal distension (gaseous): Secondary | ICD-10-CM

## 2019-06-25 ENCOUNTER — Other Ambulatory Visit: Payer: Self-pay

## 2019-06-25 ENCOUNTER — Ambulatory Visit (INDEPENDENT_AMBULATORY_CARE_PROVIDER_SITE_OTHER): Payer: BC Managed Care – PPO | Admitting: Obstetrics & Gynecology

## 2019-06-25 ENCOUNTER — Encounter: Payer: Self-pay | Admitting: Obstetrics & Gynecology

## 2019-06-25 VITALS — BP 125/77 | HR 57 | Ht 71.0 in | Wt 151.0 lb

## 2019-06-25 DIAGNOSIS — Z01419 Encounter for gynecological examination (general) (routine) without abnormal findings: Secondary | ICD-10-CM

## 2019-06-25 DIAGNOSIS — Z1151 Encounter for screening for human papillomavirus (HPV): Secondary | ICD-10-CM

## 2019-06-25 DIAGNOSIS — N952 Postmenopausal atrophic vaginitis: Secondary | ICD-10-CM

## 2019-06-25 DIAGNOSIS — R3915 Urgency of urination: Secondary | ICD-10-CM

## 2019-06-25 DIAGNOSIS — Z124 Encounter for screening for malignant neoplasm of cervix: Secondary | ICD-10-CM

## 2019-06-25 NOTE — Patient Instructions (Signed)

## 2019-06-25 NOTE — Progress Notes (Signed)
GYNECOLOGY ANNUAL PREVENTATIVE CARE ENCOUNTER NOTE  History:     Jessica Stafford is a 58 y.o. [id number] female here for a routine annual gynecologic exam.  Current complaints: increased urinary urgency and suprapubic pressure for 6 months. No dysuria, no back pain , no fevers. Had negative evaluation for UTI recently.  Had double mastectomy last year due to recurrent atypical breast cells, pathology was benign. She was told she no longer needed mammography.  Denies abnormal vaginal bleeding, discharge or other gynecologic concerns.    Gynecologic History Patient's last menstrual period was 07/13/2015 (approximate). Contraception: post menopausal status Last Pap: 06/07/2017. Results were: normal with negative HPV  Obstetric History OB History  Gravida Para Term Preterm AB Living  1 1 1     1   SAB TAB Ectopic Multiple Live Births          1    # Outcome Date GA Lbr Len/2nd Weight Sex Delivery Anes PTL Lv  1 Term 2004    M    LIV    Past Medical History:  Diagnosis Date  . Abnormal Pap smear   . Adenomyosis   . Atypical cells in breast   . Fibrocystic breast   . Fibroids   . Mitral valve prolapse   . Thyroid cancer [facility])     Past Surgical History:  Procedure Laterality Date  . BREAST BIOPSY     NUMEROUS TIMES  . CESAREAN SECTION    . CHOLECYSTECTOMY    . COLPOSCOPY    . fibriod removal  2002   uterine  . GALLBLADDER SURGERY  06/2009  . MASTECTOMY Bilateral 02/2018   At [facility], skin-sparing. Pathology showed atypical cells, no malignancy  . TOTAL THYROIDECTOMY  04/2000    Current Outpatient Medications on File Prior to Visit  Medication Sig Dispense Refill  . Calcium Citrate-Vitamin D (CALCIUM + D PO) Take 1 tablet by mouth as needed (low calcium).     Marland Kitchen ibuprofen (ADVIL) 200 MG tablet Take 400 mg by mouth every 6 (six) hours as needed (pain).     Marland Kitchen levothyroxine (LEVOXYL) 175 MCG tablet Take 175 mcg by mouth daily.     . magnesium 30 MG tablet Take 30 mg by mouth as  needed.     . Multiple Vitamin (MULTIVITAMIN) tablet Take 1 tablet by mouth as needed.     . Omega-3 Fatty Acids (OMEGA 3 PO) Take 1 capsule by mouth as needed.     . Thiamine Mononitrate (VITAMIN B1 PO) Take 1 tablet by mouth as needed.    . medroxyPROGESTERone (PROVERA) 10 MG tablet Take 1 tablet (10 mg total) by mouth daily. Use for ten days (Patient not taking: Reported on 06/07/2017) 312 tablet 6  . megestrol (MEGACE) 40 MG tablet Take 1 tablet (40 mg total) by mouth 2 (two) times daily. (Patient not taking: Reported on 05/15/2016) 60 tablet 5   No current facility-administered medications on file prior to visit.     No Known Allergies  Social History:  reports that she has never smoked. She does not have any smokeless tobacco history on file. She reports that she does not drink alcohol or use drugs.  Family History  Problem Relation Age of Onset  . Hypertension Mother   . Hypothyroidism Mother   . Heart disease Mother        mitral valve prolaspe  . Heart disease Father   . Hypertension Father   . Cancer Father  prostate  . Melanoma Brother   . Valvular heart disease Maternal Grandmother   . Diabetes Paternal Grandmother   . Cancer Paternal Grandmother        colon and ovarian  . Melanoma Brother   . Valvular heart disease Brother   . Valvular heart disease Brother     The following portions of the patient's history were reviewed and updated as appropriate: allergies, current medications, past family history, past medical history, past social history, past surgical history and problem list.  Review of Systems Pertinent items noted in HPI and remainder of comprehensive ROS otherwise negative.  Physical Exam:  BP 125/77   Pulse (!) 57   Ht 5\' 11"  (1.803 m)   Wt 151 lb (68.5 kg)   LMP 07/13/2015 (Approximate) Comment: irregular  BMI 21.06 kg/m  CONSTITUTIONAL: Well-developed, well-nourished female in no acute distress.  HENT:  Normocephalic, atraumatic, External  right and left ear normal. Oropharynx is clear and moist EYES: Conjunctivae and EOM are normal. Pupils are equal, round, and reactive to light. No scleral icterus.  NECK: Normal range of motion, supple, no masses.  Normal thyroid.  SKIN: Skin is warm and dry. No rash noted. Not diaphoretic. No erythema. No pallor. MUSCULOSKELETAL: Normal range of motion. No tenderness.  No cyanosis, clubbing, or edema.  2+ distal pulses. NEUROLOGIC: Alert and oriented to person, place, and time. Normal reflexes, muscle tone coordination. No cranial nerve deficit noted. PSYCHIATRIC: Normal mood and affect. Normal behavior. Normal judgment and thought content. CARDIOVASCULAR: Normal heart rate noted, regular rhythm RESPIRATORY: Clear to auscultation bilaterally. Effort and breath sounds normal, no problems with respiration noted. BREASTS: Symmetric in size, implants present. No masses, skin changes, nipple drainage, or lymphadenopathy. ABDOMEN: Soft, normal bowel sounds, no distention noted.  No tenderness, rebound or guarding.  PELVIC: Normal appearing external genitalia; but introitus is smaller about 1.5 cm in diameter. Smaller Pederson speculum used.  Normal appearing vaginal mucosa and cervix with moderate atrophy.  Atrophy of the external cervical os.  No abnormal discharge noted.  Pap smear obtained, unable to get endocervical cells.  Normal uterine size, no other palpable masses, no uterine or adnexal tenderness. No significant cystocele/rectocele or POP noted.  Imaging US Pelvic Complete With Transvaginal  Result Date: 06/03/2019 CLINICAL DATA:  Family history of ovarian cancer. Postmenopausal patient. EXAM: TRANSABDOMINAL AND TRANSVAGINAL ULTRASOUND OF PELVIS TECHNIQUE: Both transabdominal and transvaginal ultrasound examinations of the pelvis were performed. Transabdominal technique was performed for global imaging of the pelvis including uterus, ovaries, adnexal regions, and pelvic cul-de-sac. It was  necessary to proceed with endovaginal exam following the transabdominal exam to visualize the uterus, endometrium, and adnexal regions. COMPARISON:  08/15/2015 FINDINGS: Uterus Measurements: 7.9 x 3.2 x 4.3 centimeters. Heterogeneous myometrium. Discrete fibroids measure 1.3 x 1.2 x 1.5 centimeters, 1.4 x 1.4 x 1.7 centimeters, and 1.2 x 0.9 x 1.3 centimeters. The latter fibroid is anteriorly located and is calcified. Endometrium Thickness: 5.5.  No focal abnormality visualized. Right ovary Measurements: 3.4 x 1.1 x 2.3 centimeters = volume: 4.4 mL. Normal appearance/no adnexal mass. Left ovary Measurements: 2.2 x 1.4 x 2.2 centimeters = volume: 3.5 mL. Normal appearance/no adnexal mass. Other findings Trace free pelvic fluid. IMPRESSION: 1. Uterine fibroids, measuring up to 1.5 centimeters. 2. Normal appearance of both ovaries.  No adnexal mass. Electronically Signed   By: Nolon Nations M.D.   On: 06/03/2019 14:20      Assessment and Plan:    1. Urinary urgency Negative evaluation for  UTI. Recommended Urology/Urogynecology evaluation for further management. She wants to defer for now. Kegel exercises recommended.  2. Atrophy of vagina Discussed possible topical estrogen therapy but will clear this with her breast specialist given her atypical cells.  Will also do cyclic progestin for endometrial protection if vaginal estrogen is initiated. This may help with her urinary symptoms.  3. Well woman exam with routine gynecological exam - Cytology - PAP Will follow up results of pap smear and manage accordingly. Mammogram is not needed as per patient. Reviewed recent pelvic ultrasound results, no further intervention needed as endometrial stripe is stable at 5.87mm. Endometrial biopsy in 2018 showed atrophic endometrium. No bleeding episodes or other concerns.  Routine preventative health maintenance measures emphasized. Please refer to After Visit Summary for other counseling recommendations.       Verita Schneiders, MD, [place] for [employer], [place]

## 2019-07-06 LAB — CYTOLOGY - PAP
Comment: NEGATIVE
Diagnosis: NEGATIVE
High risk HPV: NEGATIVE

## 2019-08-02 DIAGNOSIS — Z20828 Contact with and (suspected) exposure to other viral communicable diseases: Secondary | ICD-10-CM | POA: Diagnosis not present

## 2019-08-20 DIAGNOSIS — Z20828 Contact with and (suspected) exposure to other viral communicable diseases: Secondary | ICD-10-CM | POA: Diagnosis not present

## 2019-08-31 DIAGNOSIS — R05 Cough: Secondary | ICD-10-CM | POA: Diagnosis not present

## 2019-08-31 DIAGNOSIS — Z20828 Contact with and (suspected) exposure to other viral communicable diseases: Secondary | ICD-10-CM | POA: Diagnosis not present

## 2019-09-15 DIAGNOSIS — Z03818 Encounter for observation for suspected exposure to other biological agents ruled out: Secondary | ICD-10-CM | POA: Diagnosis not present

## 2019-09-17 DIAGNOSIS — Z20828 Contact with and (suspected) exposure to other viral communicable diseases: Secondary | ICD-10-CM | POA: Diagnosis not present

## 2019-10-12 DIAGNOSIS — R002 Palpitations: Secondary | ICD-10-CM | POA: Diagnosis not present

## 2019-10-12 DIAGNOSIS — I341 Nonrheumatic mitral (valve) prolapse: Secondary | ICD-10-CM | POA: Diagnosis not present

## 2019-10-16 DIAGNOSIS — H43811 Vitreous degeneration, right eye: Secondary | ICD-10-CM | POA: Diagnosis not present

## 2019-10-22 DIAGNOSIS — H5203 Hypermetropia, bilateral: Secondary | ICD-10-CM | POA: Diagnosis not present

## 2019-10-22 DIAGNOSIS — H524 Presbyopia: Secondary | ICD-10-CM | POA: Diagnosis not present

## 2019-11-07 DIAGNOSIS — Z8616 Personal history of COVID-19: Secondary | ICD-10-CM | POA: Diagnosis not present

## 2019-11-07 DIAGNOSIS — R002 Palpitations: Secondary | ICD-10-CM | POA: Diagnosis not present

## 2019-11-07 DIAGNOSIS — R072 Precordial pain: Secondary | ICD-10-CM | POA: Diagnosis not present

## 2019-11-07 DIAGNOSIS — R079 Chest pain, unspecified: Secondary | ICD-10-CM | POA: Diagnosis not present

## 2019-11-26 DIAGNOSIS — R3915 Urgency of urination: Secondary | ICD-10-CM | POA: Diagnosis not present

## 2019-11-26 DIAGNOSIS — N882 Stricture and stenosis of cervix uteri: Secondary | ICD-10-CM | POA: Diagnosis not present

## 2019-11-26 DIAGNOSIS — R9389 Abnormal findings on diagnostic imaging of other specified body structures: Secondary | ICD-10-CM | POA: Diagnosis not present

## 2020-01-20 DIAGNOSIS — D2270 Melanocytic nevi of unspecified lower limb, including hip: Secondary | ICD-10-CM | POA: Diagnosis not present

## 2020-01-20 DIAGNOSIS — N882 Stricture and stenosis of cervix uteri: Secondary | ICD-10-CM | POA: Diagnosis not present

## 2020-01-20 DIAGNOSIS — Z01818 Encounter for other preprocedural examination: Secondary | ICD-10-CM | POA: Diagnosis not present

## 2020-01-20 DIAGNOSIS — D485 Neoplasm of uncertain behavior of skin: Secondary | ICD-10-CM | POA: Diagnosis not present

## 2020-01-20 DIAGNOSIS — R9389 Abnormal findings on diagnostic imaging of other specified body structures: Secondary | ICD-10-CM | POA: Diagnosis not present

## 2020-01-20 DIAGNOSIS — L988 Other specified disorders of the skin and subcutaneous tissue: Secondary | ICD-10-CM | POA: Diagnosis not present

## 2020-01-20 DIAGNOSIS — L814 Other melanin hyperpigmentation: Secondary | ICD-10-CM | POA: Diagnosis not present

## 2020-01-20 DIAGNOSIS — L570 Actinic keratosis: Secondary | ICD-10-CM | POA: Diagnosis not present

## 2020-01-20 DIAGNOSIS — Z20822 Contact with and (suspected) exposure to covid-19: Secondary | ICD-10-CM | POA: Diagnosis not present

## 2020-01-20 DIAGNOSIS — D2260 Melanocytic nevi of unspecified upper limb, including shoulder: Secondary | ICD-10-CM | POA: Diagnosis not present

## 2020-01-20 DIAGNOSIS — R3915 Urgency of urination: Secondary | ICD-10-CM | POA: Diagnosis not present

## 2020-01-20 DIAGNOSIS — L905 Scar conditions and fibrosis of skin: Secondary | ICD-10-CM | POA: Diagnosis not present

## 2020-01-20 DIAGNOSIS — D1801 Hemangioma of skin and subcutaneous tissue: Secondary | ICD-10-CM | POA: Diagnosis not present

## 2020-01-20 DIAGNOSIS — Z85828 Personal history of other malignant neoplasm of skin: Secondary | ICD-10-CM | POA: Diagnosis not present

## 2020-01-20 DIAGNOSIS — L821 Other seborrheic keratosis: Secondary | ICD-10-CM | POA: Diagnosis not present

## 2020-01-20 DIAGNOSIS — D225 Melanocytic nevi of trunk: Secondary | ICD-10-CM | POA: Diagnosis not present

## 2020-01-22 DIAGNOSIS — N840 Polyp of corpus uteri: Secondary | ICD-10-CM | POA: Diagnosis not present

## 2020-01-22 DIAGNOSIS — N882 Stricture and stenosis of cervix uteri: Secondary | ICD-10-CM | POA: Diagnosis not present

## 2020-01-22 DIAGNOSIS — R9389 Abnormal findings on diagnostic imaging of other specified body structures: Secondary | ICD-10-CM | POA: Diagnosis not present

## 2020-01-22 DIAGNOSIS — R3915 Urgency of urination: Secondary | ICD-10-CM | POA: Diagnosis not present

## 2020-01-29 DIAGNOSIS — M7541 Impingement syndrome of right shoulder: Secondary | ICD-10-CM | POA: Diagnosis not present

## 2020-01-29 DIAGNOSIS — M25511 Pain in right shoulder: Secondary | ICD-10-CM | POA: Diagnosis not present

## 2020-02-08 DIAGNOSIS — K9289 Other specified diseases of the digestive system: Secondary | ICD-10-CM | POA: Diagnosis not present

## 2020-02-08 DIAGNOSIS — D127 Benign neoplasm of rectosigmoid junction: Secondary | ICD-10-CM | POA: Diagnosis not present

## 2020-02-08 DIAGNOSIS — K635 Polyp of colon: Secondary | ICD-10-CM | POA: Diagnosis not present

## 2020-02-08 DIAGNOSIS — Z1211 Encounter for screening for malignant neoplasm of colon: Secondary | ICD-10-CM | POA: Diagnosis not present

## 2020-02-08 DIAGNOSIS — R12 Heartburn: Secondary | ICD-10-CM | POA: Diagnosis not present

## 2020-02-08 DIAGNOSIS — D124 Benign neoplasm of descending colon: Secondary | ICD-10-CM | POA: Diagnosis not present

## 2020-02-08 DIAGNOSIS — K228 Other specified diseases of esophagus: Secondary | ICD-10-CM | POA: Diagnosis not present

## 2020-02-08 DIAGNOSIS — Z86010 Personal history of colonic polyps: Secondary | ICD-10-CM | POA: Diagnosis not present

## 2020-02-08 DIAGNOSIS — K3189 Other diseases of stomach and duodenum: Secondary | ICD-10-CM | POA: Diagnosis not present

## 2020-02-08 DIAGNOSIS — K5730 Diverticulosis of large intestine without perforation or abscess without bleeding: Secondary | ICD-10-CM | POA: Diagnosis not present

## 2020-02-09 DIAGNOSIS — C73 Malignant neoplasm of thyroid gland: Secondary | ICD-10-CM | POA: Diagnosis not present

## 2020-02-09 DIAGNOSIS — E041 Nontoxic single thyroid nodule: Secondary | ICD-10-CM | POA: Diagnosis not present

## 2020-02-09 DIAGNOSIS — R001 Bradycardia, unspecified: Secondary | ICD-10-CM | POA: Diagnosis not present

## 2020-02-09 DIAGNOSIS — Z9089 Acquired absence of other organs: Secondary | ICD-10-CM | POA: Diagnosis not present

## 2020-02-09 DIAGNOSIS — M25511 Pain in right shoulder: Secondary | ICD-10-CM | POA: Diagnosis not present

## 2020-02-09 DIAGNOSIS — R002 Palpitations: Secondary | ICD-10-CM | POA: Diagnosis not present

## 2020-02-10 DIAGNOSIS — R002 Palpitations: Secondary | ICD-10-CM | POA: Diagnosis not present

## 2020-02-10 DIAGNOSIS — I341 Nonrheumatic mitral (valve) prolapse: Secondary | ICD-10-CM | POA: Diagnosis not present

## 2020-03-02 DIAGNOSIS — Z6820 Body mass index (BMI) 20.0-20.9, adult: Secondary | ICD-10-CM | POA: Diagnosis not present

## 2020-03-02 DIAGNOSIS — M25511 Pain in right shoulder: Secondary | ICD-10-CM | POA: Diagnosis not present

## 2020-03-02 DIAGNOSIS — M7501 Adhesive capsulitis of right shoulder: Secondary | ICD-10-CM | POA: Diagnosis not present

## 2020-03-03 DIAGNOSIS — I341 Nonrheumatic mitral (valve) prolapse: Secondary | ICD-10-CM | POA: Diagnosis not present

## 2020-03-03 DIAGNOSIS — I7789 Other specified disorders of arteries and arterioles: Secondary | ICD-10-CM | POA: Diagnosis not present

## 2020-03-09 DIAGNOSIS — M25611 Stiffness of right shoulder, not elsewhere classified: Secondary | ICD-10-CM | POA: Diagnosis not present

## 2020-03-09 DIAGNOSIS — M6281 Muscle weakness (generalized): Secondary | ICD-10-CM | POA: Diagnosis not present

## 2020-03-09 DIAGNOSIS — M25511 Pain in right shoulder: Secondary | ICD-10-CM | POA: Diagnosis not present

## 2020-03-09 DIAGNOSIS — M7501 Adhesive capsulitis of right shoulder: Secondary | ICD-10-CM | POA: Diagnosis not present

## 2020-03-14 DIAGNOSIS — M7501 Adhesive capsulitis of right shoulder: Secondary | ICD-10-CM | POA: Diagnosis not present

## 2020-03-14 DIAGNOSIS — M25611 Stiffness of right shoulder, not elsewhere classified: Secondary | ICD-10-CM | POA: Diagnosis not present

## 2020-03-14 DIAGNOSIS — M6281 Muscle weakness (generalized): Secondary | ICD-10-CM | POA: Diagnosis not present

## 2020-03-14 DIAGNOSIS — M25511 Pain in right shoulder: Secondary | ICD-10-CM | POA: Diagnosis not present

## 2020-03-28 DIAGNOSIS — M25511 Pain in right shoulder: Secondary | ICD-10-CM | POA: Diagnosis not present

## 2020-03-28 DIAGNOSIS — M6281 Muscle weakness (generalized): Secondary | ICD-10-CM | POA: Diagnosis not present

## 2020-03-28 DIAGNOSIS — M25611 Stiffness of right shoulder, not elsewhere classified: Secondary | ICD-10-CM | POA: Diagnosis not present

## 2020-03-28 DIAGNOSIS — M7501 Adhesive capsulitis of right shoulder: Secondary | ICD-10-CM | POA: Diagnosis not present

## 2020-04-05 DIAGNOSIS — M6281 Muscle weakness (generalized): Secondary | ICD-10-CM | POA: Diagnosis not present

## 2020-04-05 DIAGNOSIS — M25511 Pain in right shoulder: Secondary | ICD-10-CM | POA: Diagnosis not present

## 2020-04-05 DIAGNOSIS — M7501 Adhesive capsulitis of right shoulder: Secondary | ICD-10-CM | POA: Diagnosis not present

## 2020-04-05 DIAGNOSIS — M25611 Stiffness of right shoulder, not elsewhere classified: Secondary | ICD-10-CM | POA: Diagnosis not present

## 2020-04-15 DIAGNOSIS — N951 Menopausal and female climacteric states: Secondary | ICD-10-CM | POA: Diagnosis not present

## 2020-05-27 ENCOUNTER — Encounter: Payer: Self-pay | Admitting: Radiology

## 2020-08-05 DIAGNOSIS — H01003 Unspecified blepharitis right eye, unspecified eyelid: Secondary | ICD-10-CM | POA: Diagnosis not present

## 2020-08-05 DIAGNOSIS — Z760 Encounter for issue of repeat prescription: Secondary | ICD-10-CM | POA: Diagnosis not present

## 2020-08-22 DIAGNOSIS — R82998 Other abnormal findings in urine: Secondary | ICD-10-CM | POA: Diagnosis not present

## 2020-08-22 DIAGNOSIS — Z0000 Encounter for general adult medical examination without abnormal findings: Secondary | ICD-10-CM | POA: Diagnosis not present

## 2020-08-22 DIAGNOSIS — E039 Hypothyroidism, unspecified: Secondary | ICD-10-CM | POA: Diagnosis not present

## 2020-08-24 DIAGNOSIS — H01131 Eczematous dermatitis of right upper eyelid: Secondary | ICD-10-CM | POA: Diagnosis not present

## 2020-10-21 DIAGNOSIS — D3132 Benign neoplasm of left choroid: Secondary | ICD-10-CM | POA: Diagnosis not present

## 2020-10-21 DIAGNOSIS — H43812 Vitreous degeneration, left eye: Secondary | ICD-10-CM | POA: Diagnosis not present

## 2020-11-16 DIAGNOSIS — D3132 Benign neoplasm of left choroid: Secondary | ICD-10-CM | POA: Diagnosis not present

## 2020-11-16 DIAGNOSIS — H43812 Vitreous degeneration, left eye: Secondary | ICD-10-CM | POA: Diagnosis not present

## 2020-11-21 DIAGNOSIS — E041 Nontoxic single thyroid nodule: Secondary | ICD-10-CM | POA: Diagnosis not present

## 2020-11-21 DIAGNOSIS — C73 Malignant neoplasm of thyroid gland: Secondary | ICD-10-CM | POA: Diagnosis not present

## 2020-11-22 DIAGNOSIS — Z780 Asymptomatic menopausal state: Secondary | ICD-10-CM | POA: Diagnosis not present

## 2020-11-22 DIAGNOSIS — D3132 Benign neoplasm of left choroid: Secondary | ICD-10-CM | POA: Diagnosis not present

## 2020-11-22 DIAGNOSIS — H43813 Vitreous degeneration, bilateral: Secondary | ICD-10-CM | POA: Diagnosis not present

## 2020-11-23 DIAGNOSIS — D8989 Other specified disorders involving the immune mechanism, not elsewhere classified: Secondary | ICD-10-CM | POA: Diagnosis not present

## 2020-11-23 DIAGNOSIS — Z9049 Acquired absence of other specified parts of digestive tract: Secondary | ICD-10-CM | POA: Diagnosis not present

## 2020-11-23 DIAGNOSIS — E041 Nontoxic single thyroid nodule: Secondary | ICD-10-CM | POA: Diagnosis not present

## 2020-11-23 DIAGNOSIS — C73 Malignant neoplasm of thyroid gland: Secondary | ICD-10-CM | POA: Diagnosis not present

## 2020-11-23 DIAGNOSIS — R945 Abnormal results of liver function studies: Secondary | ICD-10-CM | POA: Diagnosis not present

## 2020-11-23 DIAGNOSIS — E890 Postprocedural hypothyroidism: Secondary | ICD-10-CM | POA: Diagnosis not present

## 2020-11-23 DIAGNOSIS — R7989 Other specified abnormal findings of blood chemistry: Secondary | ICD-10-CM | POA: Diagnosis not present

## 2020-11-23 DIAGNOSIS — M8580 Other specified disorders of bone density and structure, unspecified site: Secondary | ICD-10-CM | POA: Diagnosis not present

## 2020-11-23 DIAGNOSIS — Z1159 Encounter for screening for other viral diseases: Secondary | ICD-10-CM | POA: Diagnosis not present

## 2021-01-10 DIAGNOSIS — R3915 Urgency of urination: Secondary | ICD-10-CM | POA: Diagnosis not present

## 2021-01-10 DIAGNOSIS — Z01419 Encounter for gynecological examination (general) (routine) without abnormal findings: Secondary | ICD-10-CM | POA: Diagnosis not present

## 2021-01-10 DIAGNOSIS — N951 Menopausal and female climacteric states: Secondary | ICD-10-CM | POA: Diagnosis not present

## 2021-01-11 DIAGNOSIS — D1801 Hemangioma of skin and subcutaneous tissue: Secondary | ICD-10-CM | POA: Diagnosis not present

## 2021-01-11 DIAGNOSIS — R002 Palpitations: Secondary | ICD-10-CM | POA: Diagnosis not present

## 2021-01-11 DIAGNOSIS — L820 Inflamed seborrheic keratosis: Secondary | ICD-10-CM | POA: Diagnosis not present

## 2021-01-11 DIAGNOSIS — L814 Other melanin hyperpigmentation: Secondary | ICD-10-CM | POA: Diagnosis not present

## 2021-01-11 DIAGNOSIS — L821 Other seborrheic keratosis: Secondary | ICD-10-CM | POA: Diagnosis not present

## 2021-01-11 DIAGNOSIS — L570 Actinic keratosis: Secondary | ICD-10-CM | POA: Diagnosis not present

## 2021-01-11 DIAGNOSIS — D2260 Melanocytic nevi of unspecified upper limb, including shoulder: Secondary | ICD-10-CM | POA: Diagnosis not present

## 2021-01-11 DIAGNOSIS — I341 Nonrheumatic mitral (valve) prolapse: Secondary | ICD-10-CM | POA: Diagnosis not present

## 2021-01-18 DIAGNOSIS — Z20822 Contact with and (suspected) exposure to covid-19: Secondary | ICD-10-CM | POA: Diagnosis not present

## 2021-01-18 DIAGNOSIS — J029 Acute pharyngitis, unspecified: Secondary | ICD-10-CM | POA: Diagnosis not present

## 2021-01-29 DIAGNOSIS — Z20822 Contact with and (suspected) exposure to covid-19: Secondary | ICD-10-CM | POA: Diagnosis not present

## 2021-02-14 DIAGNOSIS — J069 Acute upper respiratory infection, unspecified: Secondary | ICD-10-CM | POA: Diagnosis not present

## 2021-02-15 DIAGNOSIS — H04123 Dry eye syndrome of bilateral lacrimal glands: Secondary | ICD-10-CM | POA: Diagnosis not present

## 2021-02-15 DIAGNOSIS — H5203 Hypermetropia, bilateral: Secondary | ICD-10-CM | POA: Diagnosis not present

## 2021-02-15 DIAGNOSIS — D3132 Benign neoplasm of left choroid: Secondary | ICD-10-CM | POA: Diagnosis not present

## 2021-02-15 DIAGNOSIS — H43812 Vitreous degeneration, left eye: Secondary | ICD-10-CM | POA: Diagnosis not present

## 2021-02-15 DIAGNOSIS — H16223 Keratoconjunctivitis sicca, not specified as Sjogren's, bilateral: Secondary | ICD-10-CM | POA: Diagnosis not present

## 2021-02-15 DIAGNOSIS — H524 Presbyopia: Secondary | ICD-10-CM | POA: Diagnosis not present

## 2021-03-28 DIAGNOSIS — D3132 Benign neoplasm of left choroid: Secondary | ICD-10-CM | POA: Diagnosis not present

## 2021-04-24 DIAGNOSIS — E559 Vitamin D deficiency, unspecified: Secondary | ICD-10-CM | POA: Diagnosis not present

## 2021-04-24 DIAGNOSIS — E039 Hypothyroidism, unspecified: Secondary | ICD-10-CM | POA: Diagnosis not present

## 2021-04-24 DIAGNOSIS — R748 Abnormal levels of other serum enzymes: Secondary | ICD-10-CM | POA: Diagnosis not present

## 2021-04-24 DIAGNOSIS — C73 Malignant neoplasm of thyroid gland: Secondary | ICD-10-CM | POA: Diagnosis not present

## 2021-04-24 DIAGNOSIS — Z79899 Other long term (current) drug therapy: Secondary | ICD-10-CM | POA: Diagnosis not present

## 2021-04-26 DIAGNOSIS — I493 Ventricular premature depolarization: Secondary | ICD-10-CM | POA: Diagnosis not present

## 2021-04-26 DIAGNOSIS — Z23 Encounter for immunization: Secondary | ICD-10-CM | POA: Diagnosis not present

## 2021-04-26 DIAGNOSIS — Z0001 Encounter for general adult medical examination with abnormal findings: Secondary | ICD-10-CM | POA: Diagnosis not present

## 2021-04-26 DIAGNOSIS — E890 Postprocedural hypothyroidism: Secondary | ICD-10-CM | POA: Diagnosis not present

## 2021-05-15 DIAGNOSIS — Z6820 Body mass index (BMI) 20.0-20.9, adult: Secondary | ICD-10-CM | POA: Diagnosis not present

## 2021-05-15 DIAGNOSIS — Z01419 Encounter for gynecological examination (general) (routine) without abnormal findings: Secondary | ICD-10-CM | POA: Diagnosis not present

## 2021-05-15 DIAGNOSIS — Z124 Encounter for screening for malignant neoplasm of cervix: Secondary | ICD-10-CM | POA: Diagnosis not present

## 2021-06-26 DIAGNOSIS — Z85850 Personal history of malignant neoplasm of thyroid: Secondary | ICD-10-CM | POA: Diagnosis not present

## 2021-06-26 DIAGNOSIS — Z79899 Other long term (current) drug therapy: Secondary | ICD-10-CM | POA: Diagnosis not present

## 2021-06-26 DIAGNOSIS — E039 Hypothyroidism, unspecified: Secondary | ICD-10-CM | POA: Diagnosis not present

## 2021-06-26 DIAGNOSIS — L989 Disorder of the skin and subcutaneous tissue, unspecified: Secondary | ICD-10-CM | POA: Diagnosis not present

## 2021-08-28 DIAGNOSIS — Z79899 Other long term (current) drug therapy: Secondary | ICD-10-CM | POA: Diagnosis not present

## 2021-08-28 DIAGNOSIS — I10 Essential (primary) hypertension: Secondary | ICD-10-CM | POA: Diagnosis not present

## 2021-10-31 DIAGNOSIS — H9311 Tinnitus, right ear: Secondary | ICD-10-CM | POA: Diagnosis not present

## 2021-10-31 DIAGNOSIS — C73 Malignant neoplasm of thyroid gland: Secondary | ICD-10-CM | POA: Diagnosis not present

## 2021-10-31 DIAGNOSIS — H9042 Sensorineural hearing loss, unilateral, left ear, with unrestricted hearing on the contralateral side: Secondary | ICD-10-CM | POA: Diagnosis not present

## 2021-12-28 DIAGNOSIS — D3132 Benign neoplasm of left choroid: Secondary | ICD-10-CM | POA: Diagnosis not present

## 2021-12-29 DIAGNOSIS — R0600 Dyspnea, unspecified: Secondary | ICD-10-CM | POA: Diagnosis not present

## 2021-12-29 DIAGNOSIS — E039 Hypothyroidism, unspecified: Secondary | ICD-10-CM | POA: Diagnosis not present

## 2021-12-29 DIAGNOSIS — I493 Ventricular premature depolarization: Secondary | ICD-10-CM | POA: Diagnosis not present

## 2021-12-29 DIAGNOSIS — Z79899 Other long term (current) drug therapy: Secondary | ICD-10-CM | POA: Diagnosis not present

## 2022-01-10 DIAGNOSIS — E890 Postprocedural hypothyroidism: Secondary | ICD-10-CM | POA: Diagnosis not present

## 2022-01-10 DIAGNOSIS — Z85850 Personal history of malignant neoplasm of thyroid: Secondary | ICD-10-CM | POA: Diagnosis not present

## 2022-01-10 DIAGNOSIS — C73 Malignant neoplasm of thyroid gland: Secondary | ICD-10-CM | POA: Diagnosis not present

## 2022-01-10 DIAGNOSIS — E041 Nontoxic single thyroid nodule: Secondary | ICD-10-CM | POA: Diagnosis not present

## 2022-02-15 DIAGNOSIS — D3132 Benign neoplasm of left choroid: Secondary | ICD-10-CM | POA: Diagnosis not present

## 2022-02-19 DIAGNOSIS — H524 Presbyopia: Secondary | ICD-10-CM | POA: Diagnosis not present

## 2022-02-19 DIAGNOSIS — H16223 Keratoconjunctivitis sicca, not specified as Sjogren's, bilateral: Secondary | ICD-10-CM | POA: Diagnosis not present

## 2022-02-19 DIAGNOSIS — D3132 Benign neoplasm of left choroid: Secondary | ICD-10-CM | POA: Diagnosis not present

## 2022-02-19 DIAGNOSIS — H5203 Hypermetropia, bilateral: Secondary | ICD-10-CM | POA: Diagnosis not present

## 2022-02-19 DIAGNOSIS — H43812 Vitreous degeneration, left eye: Secondary | ICD-10-CM | POA: Diagnosis not present

## 2022-02-19 DIAGNOSIS — H04123 Dry eye syndrome of bilateral lacrimal glands: Secondary | ICD-10-CM | POA: Diagnosis not present

## 2022-02-26 DIAGNOSIS — Z9889 Other specified postprocedural states: Secondary | ICD-10-CM | POA: Diagnosis not present

## 2022-02-26 DIAGNOSIS — R002 Palpitations: Secondary | ICD-10-CM | POA: Diagnosis not present

## 2022-02-26 DIAGNOSIS — H9311 Tinnitus, right ear: Secondary | ICD-10-CM | POA: Diagnosis not present

## 2022-02-26 DIAGNOSIS — C73 Malignant neoplasm of thyroid gland: Secondary | ICD-10-CM | POA: Diagnosis not present

## 2022-02-26 DIAGNOSIS — E041 Nontoxic single thyroid nodule: Secondary | ICD-10-CM | POA: Diagnosis not present

## 2022-02-26 DIAGNOSIS — E890 Postprocedural hypothyroidism: Secondary | ICD-10-CM | POA: Diagnosis not present

## 2022-02-27 DIAGNOSIS — R002 Palpitations: Secondary | ICD-10-CM | POA: Diagnosis not present

## 2022-02-27 DIAGNOSIS — Z01419 Encounter for gynecological examination (general) (routine) without abnormal findings: Secondary | ICD-10-CM | POA: Diagnosis not present

## 2022-02-27 DIAGNOSIS — N950 Postmenopausal bleeding: Secondary | ICD-10-CM | POA: Diagnosis not present

## 2022-02-27 DIAGNOSIS — Z124 Encounter for screening for malignant neoplasm of cervix: Secondary | ICD-10-CM | POA: Diagnosis not present

## 2022-02-27 DIAGNOSIS — R300 Dysuria: Secondary | ICD-10-CM | POA: Diagnosis not present

## 2022-02-27 DIAGNOSIS — N858 Other specified noninflammatory disorders of uterus: Secondary | ICD-10-CM | POA: Diagnosis not present

## 2022-03-26 DIAGNOSIS — R002 Palpitations: Secondary | ICD-10-CM | POA: Diagnosis not present

## 2022-03-26 DIAGNOSIS — I341 Nonrheumatic mitral (valve) prolapse: Secondary | ICD-10-CM | POA: Diagnosis not present

## 2022-03-27 DIAGNOSIS — I779 Disorder of arteries and arterioles, unspecified: Secondary | ICD-10-CM | POA: Diagnosis not present

## 2022-04-02 DIAGNOSIS — D480 Neoplasm of uncertain behavior of bone and articular cartilage: Secondary | ICD-10-CM | POA: Diagnosis not present

## 2022-04-08 DIAGNOSIS — S90561A Insect bite (nonvenomous), right ankle, initial encounter: Secondary | ICD-10-CM | POA: Diagnosis not present

## 2022-04-08 DIAGNOSIS — W57XXXA Bitten or stung by nonvenomous insect and other nonvenomous arthropods, initial encounter: Secondary | ICD-10-CM | POA: Diagnosis not present

## 2022-04-08 DIAGNOSIS — R17 Unspecified jaundice: Secondary | ICD-10-CM | POA: Diagnosis not present

## 2022-04-08 DIAGNOSIS — L989 Disorder of the skin and subcutaneous tissue, unspecified: Secondary | ICD-10-CM | POA: Diagnosis not present

## 2022-04-13 DIAGNOSIS — I6523 Occlusion and stenosis of bilateral carotid arteries: Secondary | ICD-10-CM | POA: Diagnosis not present

## 2022-04-13 DIAGNOSIS — R9089 Other abnormal findings on diagnostic imaging of central nervous system: Secondary | ICD-10-CM | POA: Diagnosis not present

## 2022-04-13 DIAGNOSIS — I773 Arterial fibromuscular dysplasia: Secondary | ICD-10-CM | POA: Diagnosis not present

## 2022-04-13 DIAGNOSIS — I10 Essential (primary) hypertension: Secondary | ICD-10-CM | POA: Diagnosis not present

## 2022-04-18 DIAGNOSIS — R002 Palpitations: Secondary | ICD-10-CM | POA: Diagnosis not present

## 2022-05-10 DIAGNOSIS — I341 Nonrheumatic mitral (valve) prolapse: Secondary | ICD-10-CM | POA: Diagnosis not present

## 2022-05-10 DIAGNOSIS — I773 Arterial fibromuscular dysplasia: Secondary | ICD-10-CM | POA: Diagnosis not present

## 2022-06-01 DIAGNOSIS — D480 Neoplasm of uncertain behavior of bone and articular cartilage: Secondary | ICD-10-CM | POA: Diagnosis not present

## 2022-06-01 DIAGNOSIS — L988 Other specified disorders of the skin and subcutaneous tissue: Secondary | ICD-10-CM | POA: Diagnosis not present

## 2022-06-26 DIAGNOSIS — I773 Arterial fibromuscular dysplasia: Secondary | ICD-10-CM | POA: Diagnosis not present

## 2022-06-27 DIAGNOSIS — I779 Disorder of arteries and arterioles, unspecified: Secondary | ICD-10-CM | POA: Diagnosis not present

## 2022-06-27 DIAGNOSIS — I341 Nonrheumatic mitral (valve) prolapse: Secondary | ICD-10-CM | POA: Diagnosis not present

## 2022-06-27 DIAGNOSIS — I38 Endocarditis, valve unspecified: Secondary | ICD-10-CM | POA: Diagnosis not present

## 2022-06-27 DIAGNOSIS — I340 Nonrheumatic mitral (valve) insufficiency: Secondary | ICD-10-CM | POA: Diagnosis not present

## 2022-06-27 DIAGNOSIS — I10 Essential (primary) hypertension: Secondary | ICD-10-CM | POA: Diagnosis not present

## 2022-06-27 DIAGNOSIS — I773 Arterial fibromuscular dysplasia: Secondary | ICD-10-CM | POA: Diagnosis not present

## 2022-06-27 DIAGNOSIS — I999 Unspecified disorder of circulatory system: Secondary | ICD-10-CM | POA: Diagnosis not present

## 2022-06-27 DIAGNOSIS — I77810 Thoracic aortic ectasia: Secondary | ICD-10-CM | POA: Diagnosis not present

## 2022-07-23 DIAGNOSIS — Z853 Personal history of malignant neoplasm of breast: Secondary | ICD-10-CM | POA: Diagnosis not present

## 2022-07-24 DIAGNOSIS — Z79899 Other long term (current) drug therapy: Secondary | ICD-10-CM | POA: Diagnosis not present

## 2022-07-24 DIAGNOSIS — I773 Arterial fibromuscular dysplasia: Secondary | ICD-10-CM | POA: Diagnosis not present

## 2022-07-24 DIAGNOSIS — Z85850 Personal history of malignant neoplasm of thyroid: Secondary | ICD-10-CM | POA: Diagnosis not present

## 2022-07-24 DIAGNOSIS — Z9013 Acquired absence of bilateral breasts and nipples: Secondary | ICD-10-CM | POA: Diagnosis not present

## 2022-07-31 DIAGNOSIS — Z8041 Family history of malignant neoplasm of ovary: Secondary | ICD-10-CM | POA: Diagnosis not present

## 2022-07-31 DIAGNOSIS — Z23 Encounter for immunization: Secondary | ICD-10-CM | POA: Diagnosis not present

## 2022-07-31 DIAGNOSIS — Z853 Personal history of malignant neoplasm of breast: Secondary | ICD-10-CM | POA: Diagnosis not present

## 2022-07-31 DIAGNOSIS — I779 Disorder of arteries and arterioles, unspecified: Secondary | ICD-10-CM | POA: Diagnosis not present

## 2022-08-07 DIAGNOSIS — H01022 Squamous blepharitis right lower eyelid: Secondary | ICD-10-CM | POA: Diagnosis not present

## 2022-08-07 DIAGNOSIS — H0289 Other specified disorders of eyelid: Secondary | ICD-10-CM | POA: Diagnosis not present

## 2022-08-14 DIAGNOSIS — R300 Dysuria: Secondary | ICD-10-CM | POA: Diagnosis not present

## 2022-08-14 DIAGNOSIS — Z6821 Body mass index (BMI) 21.0-21.9, adult: Secondary | ICD-10-CM | POA: Diagnosis not present

## 2022-08-14 DIAGNOSIS — Z01419 Encounter for gynecological examination (general) (routine) without abnormal findings: Secondary | ICD-10-CM | POA: Diagnosis not present

## 2022-08-30 DIAGNOSIS — I340 Nonrheumatic mitral (valve) insufficiency: Secondary | ICD-10-CM | POA: Diagnosis not present

## 2022-08-30 DIAGNOSIS — I773 Arterial fibromuscular dysplasia: Secondary | ICD-10-CM | POA: Diagnosis not present

## 2022-08-30 DIAGNOSIS — I77819 Aortic ectasia, unspecified site: Secondary | ICD-10-CM | POA: Diagnosis not present

## 2022-08-30 DIAGNOSIS — M2141 Flat foot [pes planus] (acquired), right foot: Secondary | ICD-10-CM | POA: Diagnosis not present
